# Patient Record
Sex: Female | Born: 1955 | Race: White | Hispanic: No | Marital: Married | Smoking: Former smoker
Health system: Southern US, Community
[De-identification: ages and names within clinical notes are randomized; demographics above are authoritative.]

## PROBLEM LIST (undated history)

## (undated) DIAGNOSIS — C801 Malignant (primary) neoplasm, unspecified: Secondary | ICD-10-CM

## (undated) DIAGNOSIS — C7951 Secondary malignant neoplasm of bone: Secondary | ICD-10-CM

## (undated) DIAGNOSIS — E785 Hyperlipidemia, unspecified: Secondary | ICD-10-CM

## (undated) DIAGNOSIS — C220 Liver cell carcinoma: Secondary | ICD-10-CM

## (undated) DIAGNOSIS — F419 Anxiety disorder, unspecified: Secondary | ICD-10-CM

## (undated) DIAGNOSIS — Z8669 Personal history of other diseases of the nervous system and sense organs: Secondary | ICD-10-CM

## (undated) DIAGNOSIS — K7460 Unspecified cirrhosis of liver: Secondary | ICD-10-CM

---

## 1999-01-02 ENCOUNTER — Inpatient Hospital Stay (HOSPITAL_COMMUNITY): Admission: RE | Admit: 1999-01-02 | Discharge: 1999-01-04 | Payer: Self-pay | Admitting: Obstetrics and Gynecology

## 2008-09-11 ENCOUNTER — Other Ambulatory Visit: Admission: RE | Admit: 2008-09-11 | Discharge: 2008-09-11 | Payer: Self-pay | Admitting: Family Medicine

## 2012-07-02 ENCOUNTER — Other Ambulatory Visit: Payer: Self-pay

## 2012-07-02 ENCOUNTER — Ambulatory Visit
Admission: RE | Admit: 2012-07-02 | Discharge: 2012-07-02 | Disposition: A | Discharge status: Home / Self Care | Payer: Self-pay | Source: Ambulatory Visit | Attending: Family Medicine | Admitting: Family Medicine

## 2012-07-02 ENCOUNTER — Other Ambulatory Visit: Payer: Self-pay | Admitting: Family Medicine

## 2012-07-02 DIAGNOSIS — R61 Generalized hyperhidrosis: Secondary | ICD-10-CM

## 2012-07-02 DIAGNOSIS — R509 Fever, unspecified: Secondary | ICD-10-CM

## 2012-07-02 MED ORDER — IOHEXOL 300 MG/ML  SOLN
100.0000 mL | Freq: Once | INTRAMUSCULAR | Status: AC | PRN
  Administered 2012-07-02: 100 mL via INTRAVENOUS

## 2012-07-07 ENCOUNTER — Other Ambulatory Visit (HOSPITAL_COMMUNITY): Payer: Self-pay | Admitting: Gastroenterology

## 2012-07-07 DIAGNOSIS — K769 Liver disease, unspecified: Secondary | ICD-10-CM

## 2012-07-09 ENCOUNTER — Other Ambulatory Visit: Payer: Self-pay | Admitting: Radiology

## 2012-07-12 ENCOUNTER — Other Ambulatory Visit (HOSPITAL_COMMUNITY): Payer: Self-pay

## 2012-07-13 ENCOUNTER — Encounter (HOSPITAL_COMMUNITY): Payer: Self-pay | Admitting: Pharmacy Technician

## 2012-07-13 ENCOUNTER — Other Ambulatory Visit (HOSPITAL_COMMUNITY): Payer: Self-pay | Admitting: Gastroenterology

## 2012-07-13 ENCOUNTER — Ambulatory Visit (HOSPITAL_COMMUNITY)
Admission: RE | Admit: 2012-07-13 | Discharge: 2012-07-13 | Disposition: A | Discharge status: Home / Self Care | Payer: BC Managed Care – PPO | Source: Ambulatory Visit | Attending: Gastroenterology | Admitting: Gastroenterology

## 2012-07-13 ENCOUNTER — Encounter (HOSPITAL_COMMUNITY): Payer: Self-pay

## 2012-07-13 DIAGNOSIS — K7689 Other specified diseases of liver: Secondary | ICD-10-CM | POA: Insufficient documentation

## 2012-07-13 DIAGNOSIS — K769 Liver disease, unspecified: Secondary | ICD-10-CM

## 2012-07-13 LAB — CBC
MCH: 25.9 pg — ABNORMAL LOW (ref 26.0–34.0)
MCV: 83.5 fL (ref 78.0–100.0)
Platelets: 577 10*3/uL — ABNORMAL HIGH (ref 150–400)
RBC: 3.63 MIL/uL — ABNORMAL LOW (ref 3.87–5.11)

## 2012-07-13 MED ORDER — HYDROCODONE-ACETAMINOPHEN 5-325 MG PO TABS: 1.0000 | ORAL_TABLET | ORAL | Status: DC | PRN

## 2012-07-13 MED ORDER — MIDAZOLAM HCL 2 MG/2ML IJ SOLN
INTRAMUSCULAR | Status: AC | PRN
  Administered 2012-07-13 (×3): 0.5 mg via INTRAVENOUS
  Administered 2012-07-13: 1 mg via INTRAVENOUS

## 2012-07-13 MED ORDER — ONDANSETRON HCL 4 MG/2ML IJ SOLN
INTRAMUSCULAR | Status: AC | PRN
Start: 2012-07-13 — End: 2012-07-13
  Administered 2012-07-13: 4 mg via INTRAVENOUS

## 2012-07-13 MED ORDER — SODIUM CHLORIDE 0.9 % IV SOLN
Freq: Once | INTRAVENOUS | Status: AC
  Administered 2012-07-13: 13:00:00 via INTRAVENOUS

## 2012-07-13 MED ORDER — FENTANYL CITRATE 0.05 MG/ML IJ SOLN
INTRAMUSCULAR | Status: AC
  Filled 2012-07-13: qty 4

## 2012-07-13 MED ORDER — ONDANSETRON HCL 4 MG/2ML IJ SOLN
INTRAMUSCULAR | Status: AC
  Filled 2012-07-13: qty 2

## 2012-07-13 MED ORDER — MIDAZOLAM HCL 2 MG/2ML IJ SOLN
INTRAMUSCULAR | Status: AC
  Filled 2012-07-13: qty 4

## 2012-07-13 MED ORDER — ONDANSETRON HCL 4 MG/2ML IJ SOLN: 4.0000 mg | Freq: Once | INTRAMUSCULAR | Status: DC

## 2012-07-13 MED ORDER — FENTANYL CITRATE 0.05 MG/ML IJ SOLN
INTRAMUSCULAR | Status: AC | PRN
  Administered 2012-07-13 (×3): 25 ug via INTRAVENOUS
  Administered 2012-07-13: 50 ug via INTRAVENOUS

## 2012-07-13 NOTE — Procedures (Signed)
Liver lesion Bx No comp

## 2012-07-13 NOTE — H&P (Signed)
Chief Complaint: "I'm here for a liver biopsy" Referring Physician:Katopes HPI: Amanda Oconnell is an 56 y.o. female who is found to have a liver mass after workup of FUO.  Denies any infectious sxs such as cough, abd pain, dysuria, diarrhea. She has been referred for biopsy.  Past Medical History: No past medical history on file.  Past Surgical History: No past surgical history on file.  Family History: No family history on file.  Social History: Denies tobacco, occasional EtOH  Allergies: No Known Allergies  Medications: No reg meds  Please HPI for pertinent positives, otherwise complete 10 system ROS negative.  Physical Exam: There were no vitals taken for this visit.    General Appearance:  Alert, cooperative, no distress, appears stated age, thin  Head:  Normocephalic, without obvious abnormality, atraumatic  ENT: Unremarkable  Neck: Supple, symmetrical, trachea midline, no adenopathy, thyroid: not enlarged, symmetric, no tenderness/mass/nodules  Lungs:   Clear to auscultation bilaterally, no w/r/r, respirations unlabored without use of accessory muscles.  Heart:  Regular rate and rhythm, S1, S2 normal, no murmur, rub or gallop. Carotids 2+ without bruit.  Abdomen:   Soft, non-tender, non distended. Bowel sounds active all four quadrants,  no masses, no organomegaly.  Neurologic: Normal affect, no gross deficits.   Results for orders placed during the hospital encounter of 07/13/12 (from the past 48 hour(s))  CBC     Status: Abnormal   Collection Time   07/13/12 12:47 PM      Component Value Range Comment   WBC 13.9 (*) 4.0 - 10.5 K/uL    RBC 3.63 (*) 3.87 - 5.11 MIL/uL    Hemoglobin 9.4 (*) 12.0 - 15.0 g/dL    HCT 04.5 (*) 40.9 - 46.0 %    MCV 83.5  78.0 - 100.0 fL    MCH 25.9 (*) 26.0 - 34.0 pg    MCHC 31.0  30.0 - 36.0 g/dL    RDW 81.1  91.4 - 78.2 %    Platelets 577 (*) 150 - 400 K/uL    No results found.  Assessment/Plan Liver mass Discussed US  guided biopsy procedure, including risks and complications Labs pending Consent signed in chart Path report will need to be forwarded to Dr. Yevonne Pax at [facility].  Brayton El PA-C 07/13/2012, 1:35 PM

## 2012-07-20 ENCOUNTER — Inpatient Hospital Stay (HOSPITAL_COMMUNITY): Admission: RE | Admit: 2012-07-20 | Payer: Self-pay | Source: Ambulatory Visit

## 2012-07-23 ENCOUNTER — Encounter (HOSPITAL_COMMUNITY): Payer: Self-pay | Admitting: Pharmacy Technician

## 2012-07-23 ENCOUNTER — Other Ambulatory Visit (HOSPITAL_COMMUNITY): Payer: Self-pay | Admitting: Gastroenterology

## 2012-07-23 DIAGNOSIS — R160 Hepatomegaly, not elsewhere classified: Secondary | ICD-10-CM

## 2012-07-23 DIAGNOSIS — C220 Liver cell carcinoma: Secondary | ICD-10-CM

## 2012-07-26 ENCOUNTER — Other Ambulatory Visit: Payer: Self-pay | Admitting: Radiology

## 2012-07-27 ENCOUNTER — Ambulatory Visit (HOSPITAL_COMMUNITY)
Admission: RE | Admit: 2012-07-27 | Discharge: 2012-07-27 | Disposition: A | Discharge status: Home / Self Care | Payer: BC Managed Care – PPO | Source: Ambulatory Visit | Attending: Gastroenterology | Admitting: Gastroenterology

## 2012-07-27 ENCOUNTER — Other Ambulatory Visit: Payer: Self-pay | Admitting: Radiology

## 2012-07-27 ENCOUNTER — Encounter (HOSPITAL_COMMUNITY): Payer: Self-pay

## 2012-07-27 DIAGNOSIS — C228 Malignant neoplasm of liver, primary, unspecified as to type: Secondary | ICD-10-CM | POA: Insufficient documentation

## 2012-07-27 DIAGNOSIS — K769 Liver disease, unspecified: Secondary | ICD-10-CM | POA: Insufficient documentation

## 2012-07-27 DIAGNOSIS — C220 Liver cell carcinoma: Secondary | ICD-10-CM

## 2012-07-27 DIAGNOSIS — R160 Hepatomegaly, not elsewhere classified: Secondary | ICD-10-CM

## 2012-07-27 HISTORY — DX: Malignant (primary) neoplasm, unspecified: C80.1

## 2012-07-27 LAB — CBC
HCT: 28.9 % — ABNORMAL LOW (ref 36.0–46.0)
MCH: 25.1 pg — ABNORMAL LOW (ref 26.0–34.0)
MCV: 82.3 fL (ref 78.0–100.0)
RDW: 15.2 % (ref 11.5–15.5)
WBC: 10.3 10*3/uL (ref 4.0–10.5)

## 2012-07-27 MED ORDER — ONDANSETRON HCL 4 MG/2ML IJ SOLN
4.0000 mg | Freq: Once | INTRAMUSCULAR | Status: AC
  Administered 2012-07-27: 4 mg via INTRAVENOUS

## 2012-07-27 MED ORDER — FENTANYL CITRATE 0.05 MG/ML IJ SOLN
INTRAMUSCULAR | Status: AC | PRN
  Administered 2012-07-27: 50 ug via INTRAVENOUS
  Administered 2012-07-27: 25 ug via INTRAVENOUS
  Administered 2012-07-27: 50 ug via INTRAVENOUS

## 2012-07-27 MED ORDER — MIDAZOLAM HCL 2 MG/2ML IJ SOLN
INTRAMUSCULAR | Status: AC | PRN
  Administered 2012-07-27: 2 mg via INTRAVENOUS
  Administered 2012-07-27: 0.5 mg via INTRAVENOUS

## 2012-07-27 MED ORDER — MIDAZOLAM HCL 2 MG/2ML IJ SOLN
INTRAMUSCULAR | Status: AC
  Filled 2012-07-27: qty 4

## 2012-07-27 MED ORDER — HYDROCODONE-ACETAMINOPHEN 5-325 MG PO TABS: 1.0000 | ORAL_TABLET | ORAL | Status: DC | PRN

## 2012-07-27 MED ORDER — SODIUM CHLORIDE 0.9 % IV SOLN: Freq: Once | INTRAVENOUS | Status: DC

## 2012-07-27 MED ORDER — FENTANYL CITRATE 0.05 MG/ML IJ SOLN
INTRAMUSCULAR | Status: AC
  Filled 2012-07-27: qty 4

## 2012-07-27 MED ORDER — ONDANSETRON HCL 4 MG/2ML IJ SOLN
INTRAMUSCULAR | Status: AC
  Administered 2012-07-27: 4 mg via INTRAVENOUS
  Filled 2012-07-27: qty 2

## 2012-07-27 NOTE — Procedures (Signed)
Random liver core Bx L lobe 18 g core times 4 No comp

## 2012-07-27 NOTE — H&P (Signed)
Amanda Oconnell is an 56 y.o. female.   Chief Complaint: Dx Hepatocellular Ca 07/13/12 Scheduled now for random bx of left lobe to evaluate for cirrhosis HPI: Hepato Ca  Past Medical History  Diagnosis Date  . Cancer     hepatocellular Ca    History reviewed. No pertinent past surgical history.  History reviewed. No pertinent family history. Social History:  reports that she has quit smoking. She does not have any smokeless tobacco history on file. Her alcohol and drug histories not on file.  Allergies: No Known Allergies   (Not in a hospital admission)  Results for orders placed during the hospital encounter of 07/27/12 (from the past 48 hour(s))  APTT     Status: Abnormal   Collection Time   07/27/12  8:39 AM      Component Value Range Comment   aPTT 38 (*) 24 - 37 seconds   CBC     Status: Abnormal   Collection Time   07/27/12  8:39 AM      Component Value Range Comment   WBC 10.3  4.0 - 10.5 K/uL    RBC 3.51 (*) 3.87 - 5.11 MIL/uL    Hemoglobin 8.8 (*) 12.0 - 15.0 g/dL    HCT 04.5 (*) 40.9 - 46.0 %    MCV 82.3  78.0 - 100.0 fL    MCH 25.1 (*) 26.0 - 34.0 pg    MCHC 30.4  30.0 - 36.0 g/dL    RDW 81.1  91.4 - 78.2 %    Platelets 653 (*) 150 - 400 K/uL   PROTIME-INR     Status: Normal   Collection Time   07/27/12  8:39 AM      Component Value Range Comment   Prothrombin Time 14.8  11.6 - 15.2 seconds    INR 1.18  0.00 - 1.49    No results found.  Review of Systems  Constitutional: Negative for weight loss.  Cardiovascular: Negative for chest pain.  Gastrointestinal: Positive for abdominal pain. Negative for nausea and vomiting.    Blood pressure 125/68, pulse 84, temperature 99 F (37.2 C), temperature source Oral, resp. rate 18, height 5\' 3"  (1.6 m), weight 99 lb (44.906 kg), SpO2 100.00%. Physical Exam  Constitutional: She is oriented to person, place, and time. She appears well-developed and well-nourished.  Cardiovascular: Normal rate, regular  rhythm and normal heart sounds.   No murmur heard. Respiratory: Effort normal and breath sounds normal.  GI: Soft. Bowel sounds are normal. There is no tenderness.  Musculoskeletal: Normal range of motion.  Neurological: She is alert and oriented to person, place, and time.  Skin: Skin is warm and dry.  Psychiatric: She has a normal mood and affect. Her behavior is normal. Judgment and thought content normal.     Assessment/Plan Newly dx hepatocellular ca Now scheduled for random bx of left lobe to evaluate for cirrhosis Pt aware of procedure benefits and risks and agreeable to proceed Consent signed and in chart  Talton Delpriore A 07/27/2012, 9:27 AM

## 2012-07-30 ENCOUNTER — Telehealth (HOSPITAL_COMMUNITY): Payer: Self-pay

## 2012-08-17 HISTORY — PX: CHOLECYSTECTOMY: SHX55

## 2012-08-17 HISTORY — PX: LIVER RESECTION: SHX1977

## 2013-01-31 IMAGING — US US BIOPSY
1 series · 8 of 8 positions shown · non-contrast
Comparison: none

Clinical Data/Indication: Liver lesion

ULTRASOUND-GUIDED BIOPSY OF A CENTRAL LIVER LESION.  CORE.
Sedation: Versed 2.25 mg, Fentanyl 125 mcg.
Total Moderate Sedation Time: 18 minutes.
Additional Medications: Zofran 4 mg IV.
Procedure: The procedure, risks, benefits, and alternatives were
explained to the patient. Questions regarding the procedure were
encouraged and answered. The patient understands and consents to
the procedure.
The right upper quadrant was prepped with betadine in a sterile
fashion, and a sterile drape was applied covering the operative
field. A mask and sterile gloves were used for the procedure.
Under sonographic guidance, a 17 gauge needle was inserted into the
right lobe of the liver and directed towards the central liver
lesions.  For the 18 gauge core biopsies were obtained.  Gelfoam
slurry was injected into the tract during guide needle removal.
Final imaging was performed.
Patient tolerated the procedure well without complication.  Vital
sign monitoring by nursing staff during the procedure will continue
as patient is in the special procedures unit for post procedure
observation.

[Series 1: us biopsy · 0.32mm/px · 8 of 8 slices shown]
[im 1/8]
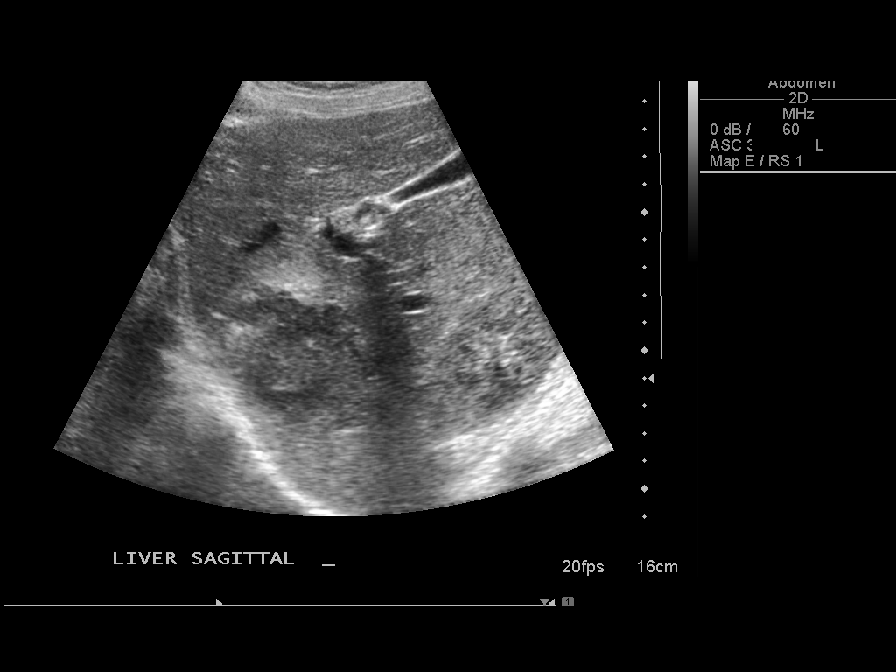
[im 2/8]
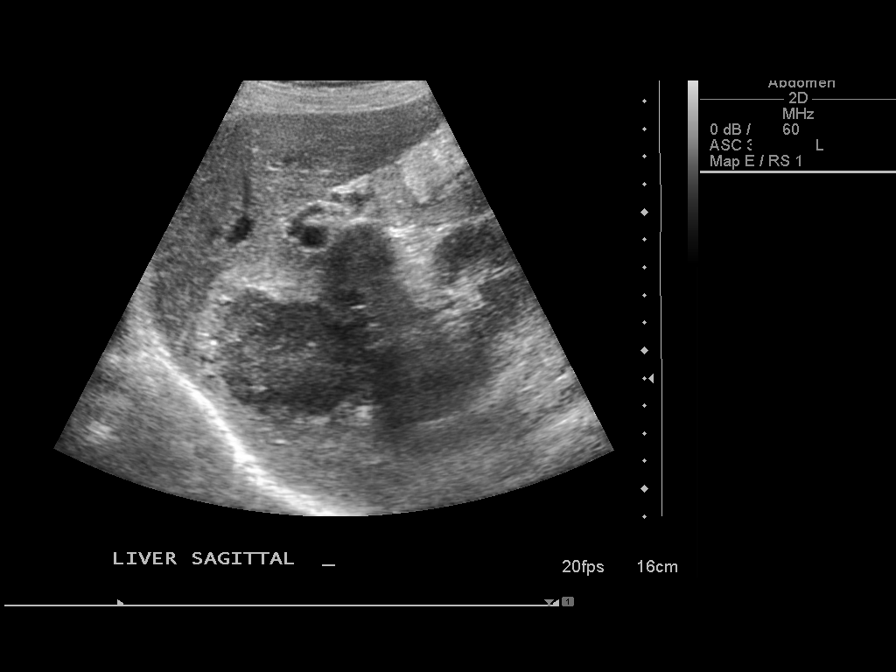
[im 3/8]
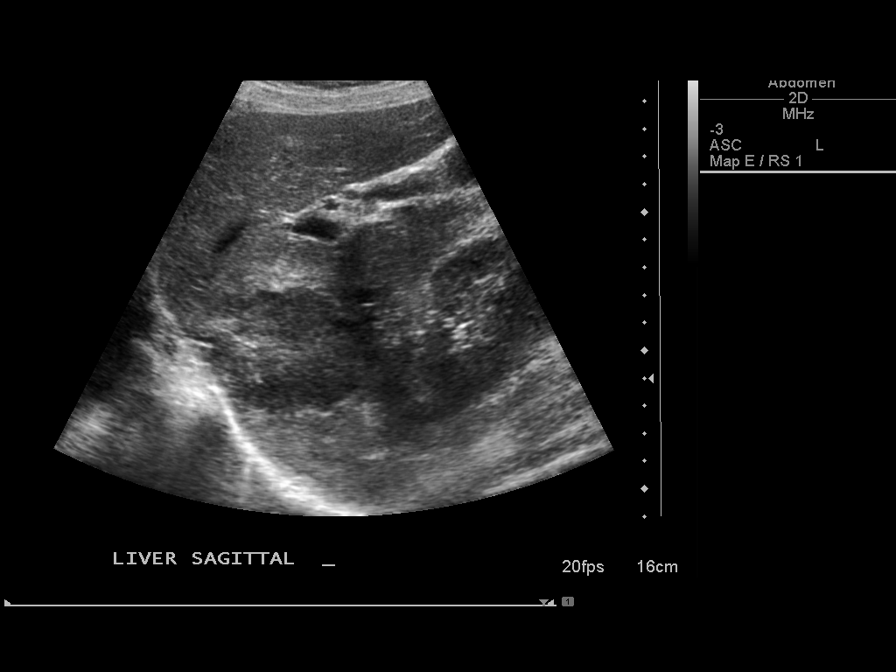
[im 4/8]
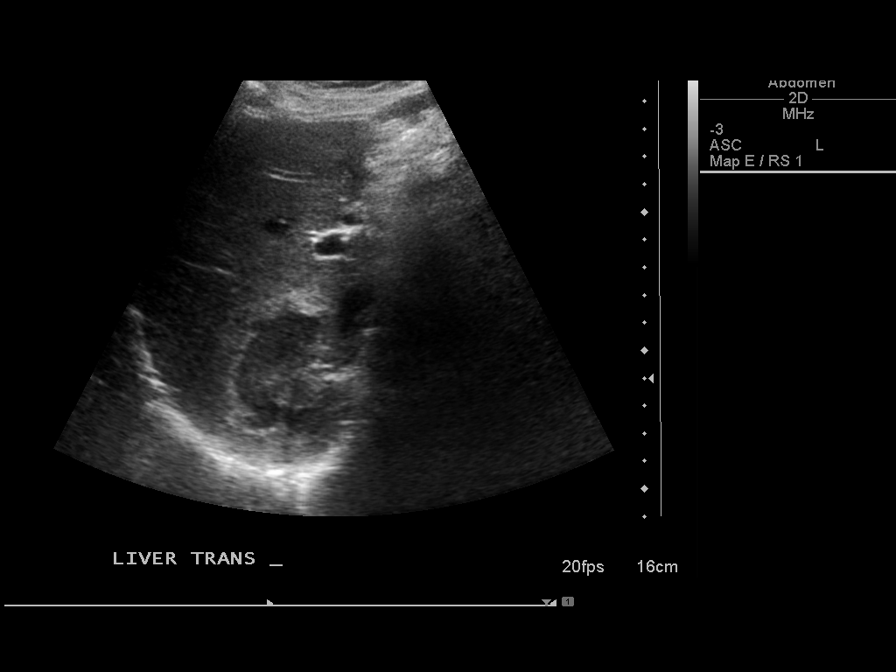
[im 5/8]
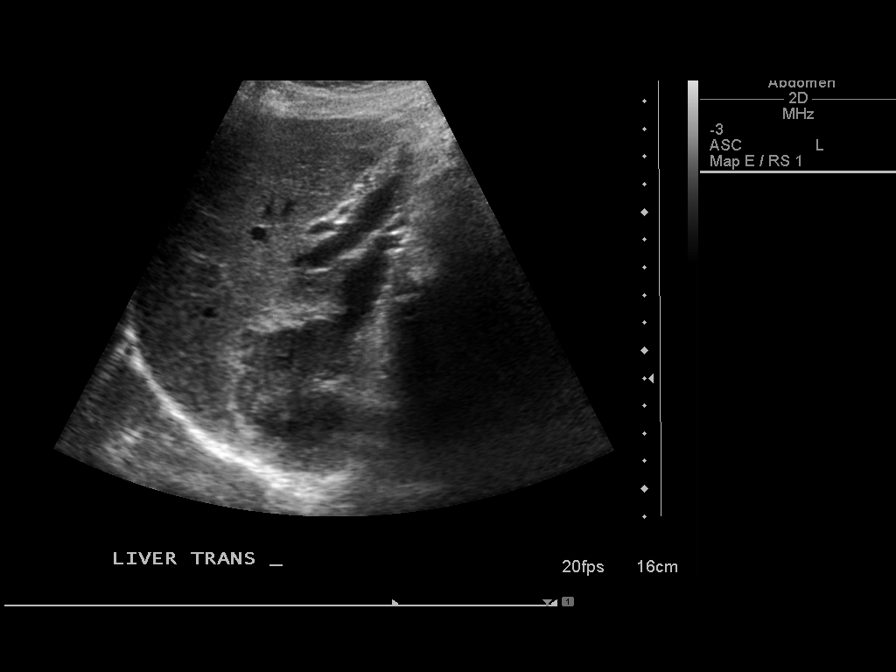
[im 6/8]
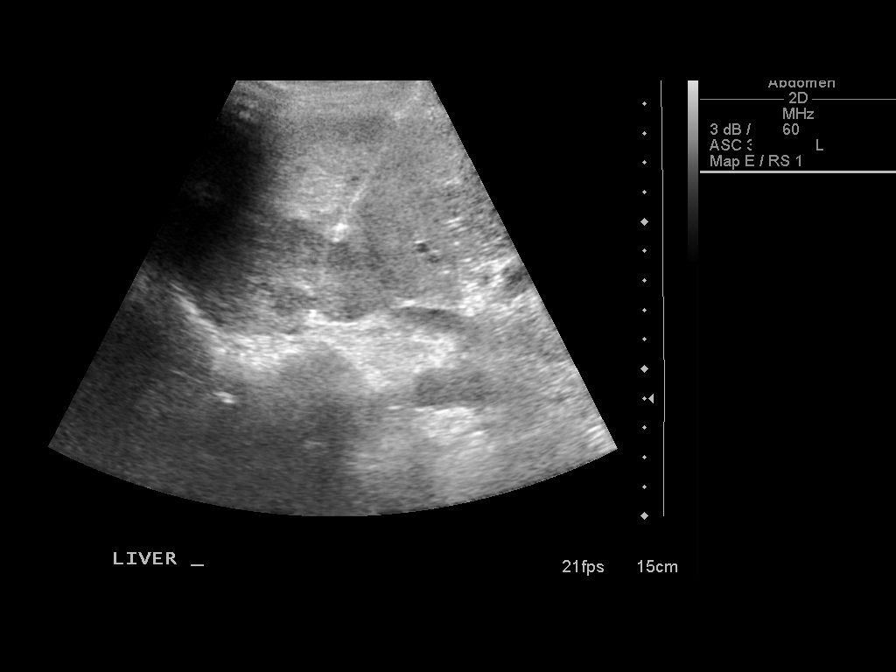
[im 7/8]
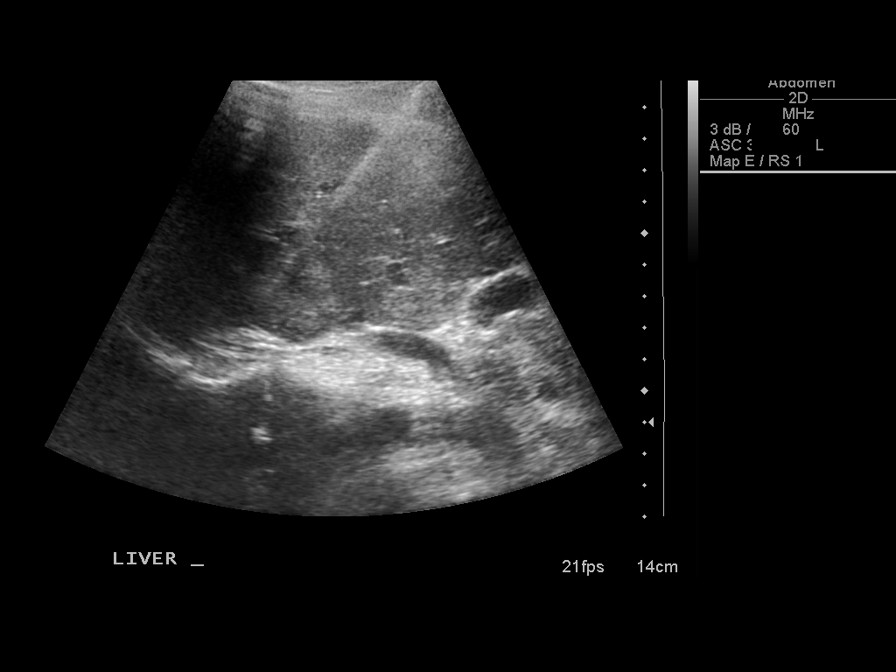
[im 8/8]
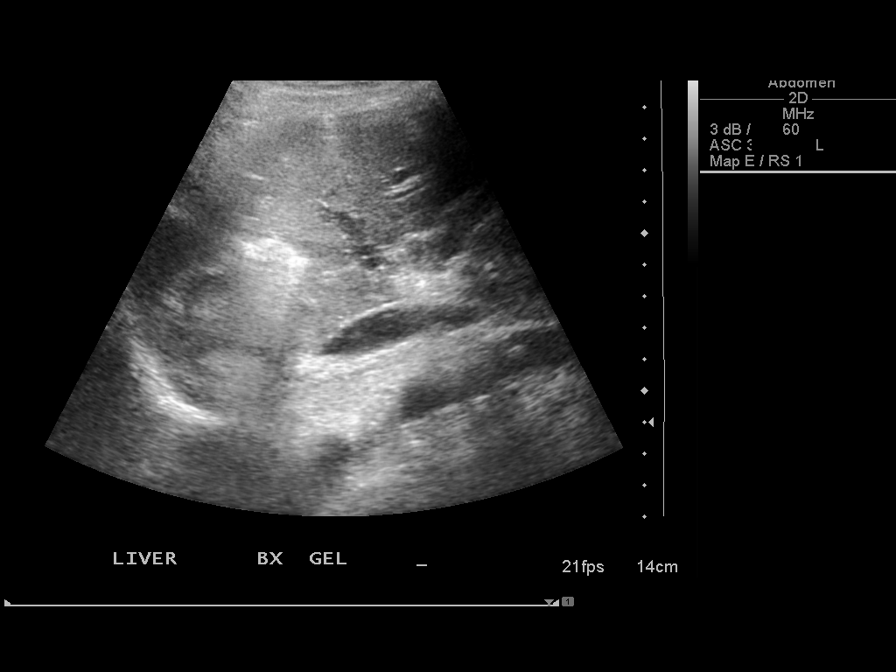

[8 of 8 positions shown; findings below may reference images not displayed]

FINDINGS: The images document guide needle placement within the
central liver lesion. Post biopsy images demonstrate no hemorrhage.
IMPRESSION: Successful ultrasound-guided core biopsy of a central liver lesion.

## 2013-02-22 ENCOUNTER — Encounter: Payer: Self-pay | Admitting: Dietician

## 2013-02-22 ENCOUNTER — Encounter: Payer: BC Managed Care – PPO | Attending: Family Medicine | Admitting: Dietician

## 2013-02-22 VITALS — Ht 63.0 in | Wt 90.6 lb

## 2013-02-22 DIAGNOSIS — Z713 Dietary counseling and surveillance: Secondary | ICD-10-CM | POA: Insufficient documentation

## 2013-02-22 DIAGNOSIS — R634 Abnormal weight loss: Secondary | ICD-10-CM | POA: Insufficient documentation

## 2013-02-22 NOTE — Progress Notes (Signed)
Medical Nutrition Therapy:  Appt start time: 0900 end time:  1000.  Assessment:  Primary concerns today: unintentional wt loss.   Pt states normal wt of 110-115 lbs. Lowest wt of 85 lbs post surgery. Now 90.6 lbs.   MEDICATIONS: see list.   DIETARY INTAKE:  Usual eating pattern includes 3 meals and 2 snacks per day.  Everyday foods include coke, sweet tea.  Avoided foods include none noted.    24-hr recall:  B ( AM): cheerios (whole milk) with hot tea (sweet n low)  Snk ( AM): none L ( PM): sandwich (ham or roast beef, sometimes with cheese, mayo, white bread), chips (small bag), coke Snk ( PM): candy bar (king size) or peanuts, sometimes nothing  D ( PM): meat (usually beef), salad, potato, or veg (broccoli, green beans), coke or sweet tea (sugar) Snk ( PM): popcorn, sometimes ice cream, coke Beverages: hot tea, coke, sweet tea  Usual physical activity: PT 3 days per week, nothing else since surgery  Progress Towards Goal(s):  In progress.   Nutritional Diagnosis:  [place]-3.2 Unintentional weight loss As related to liver surgery, decreased appetitie.  As evidenced by pt reports of difficulty taking in large portions at any meal, recent wt loss of 25-30 lbs.    Intervention:  Nutrition counseling provided for High Protein, High Calorie Nutrition Therapy. Pt also requested information for general guidelines to improve health and reduce cancer risk once appropriate weight reestablished. RD provided basic outline on incorporating high protein and high fiber foods at each meal, and focusing on plant or fish-based fat sources (for after weight regain), while limiting animal based fats and simple sugars.  Handouts given during visit include:  High Protein, High Calorie Nutrition Therapy  High Fat, Sugar, Protein, Fiber foods list  Monitoring/Evaluation:  Dietary intake, exercise, appetite, and body weight in 2 month(s).

## 2013-03-14 ENCOUNTER — Encounter: Payer: Self-pay | Admitting: Family Medicine

## 2013-03-21 DIAGNOSIS — C801 Malignant (primary) neoplasm, unspecified: Secondary | ICD-10-CM | POA: Insufficient documentation

## 2013-03-22 ENCOUNTER — Encounter: Payer: Self-pay | Admitting: Radiation Oncology

## 2013-03-22 DIAGNOSIS — C220 Liver cell carcinoma: Secondary | ICD-10-CM | POA: Insufficient documentation

## 2013-03-22 NOTE — Progress Notes (Addendum)
Histology and Location of Primary Cancer: liver  Liver, needle/core biopsy, Central - HEPATOCELLULAR CARCINOMA. - SEE COMMENT. Microscopic Comment The carcinoma appears grade 2. By immunohistochemistry, the malignant cells are positive for cytokeratin 7 and focally positive for CEA. They are negative for TTF-1, Hepar-1, GCDFP, TTF, cytokeratin 20, estrogen receptor, CDX-2, and CD34. Although this immunohistochemistry profile is non-specific, the histologic features are consistent with hepatocellular  Sites of Visceral and Bony Metastatic Disease: L1, L 5, S1, bilateral femurs, new hepatic lesions  Location(s) of Symptomatic Metastases: lumbar spine, compression fx L1, L5  Past/Anticipated chemotherapy by medical oncology, if any: possible chemotherapy at [facility], trial E 7080, Dr Reginia Naas  Pain on a scale of 0-10 is:   Lumbar back pain since March, wears back brace except when sleeping   If Spine Met(s), symptoms, if any, include:  Bowel/Bladder retention or incontinence (please describe): none  Numbness or weakness in extremities (please describe): none  Current Decadron regimen, if applicable: none  Ambulatory status? Walker? Wheelchair?: ambulatory  SAFETY ISSUES:  Prior radiation? no  Pacemaker/ICD? no  Possible current pregnancy? no  Is the patient on methotrexate? 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 Pt c/o lower back pain today 7-8/10. She has no taken Oxycodone since last night, states it makes it difficult for her to function during the day. She states at times she has no pain. She is requesting refill on Xanax, Oxycodone today. Pt states she has n/v at times, Phenergan prn w/fair to good relief.  Pt circled 10 on distress screen; left vm for SW. Her concerns checked on the screen are n/v, loss of appetite. Spoke w/pt who states "I am not suicidal. I am not suicidal."  Informed the pt about [facility]  support team and what they can offer her, and she verbalized understanding and appreciation for the information. Pt here w/spouse and friend.

## 2013-03-23 ENCOUNTER — Ambulatory Visit
Admission: RE | Admit: 2013-03-23 | Discharge: 2013-03-23 | Disposition: A | Discharge status: Home / Self Care | Payer: BC Managed Care – PPO | Source: Ambulatory Visit | Attending: Radiation Oncology | Admitting: Radiation Oncology

## 2013-03-23 ENCOUNTER — Encounter: Payer: Self-pay | Admitting: Radiation Oncology

## 2013-03-23 VITALS — BP 128/78 | HR 102 | Temp 98.4°F | Resp 20 | Ht 63.0 in | Wt 90.1 lb

## 2013-03-23 DIAGNOSIS — C228 Malignant neoplasm of liver, primary, unspecified as to type: Secondary | ICD-10-CM | POA: Insufficient documentation

## 2013-03-23 DIAGNOSIS — C7951 Secondary malignant neoplasm of bone: Secondary | ICD-10-CM

## 2013-03-23 DIAGNOSIS — C220 Liver cell carcinoma: Secondary | ICD-10-CM

## 2013-03-23 HISTORY — DX: Anxiety disorder, unspecified: F41.9

## 2013-03-23 HISTORY — DX: Unspecified cirrhosis of liver: K74.60

## 2013-03-23 HISTORY — DX: Hyperlipidemia, unspecified: E78.5

## 2013-03-23 HISTORY — DX: Liver cell carcinoma: C22.0

## 2013-03-23 HISTORY — DX: Personal history of other diseases of the nervous system and sense organs: Z86.69

## 2013-03-23 HISTORY — DX: Secondary malignant neoplasm of bone: C79.51

## 2013-03-23 MED ORDER — ONDANSETRON HCL 8 MG PO TABS: 8.0000 mg | ORAL_TABLET | Freq: Three times a day (TID) | ORAL | Status: AC | PRN

## 2013-03-23 MED ORDER — PROMETHAZINE HCL 25 MG PO TABS: 25.0000 mg | ORAL_TABLET | Freq: Four times a day (QID) | ORAL | Status: AC | PRN

## 2013-03-23 MED ORDER — LORAZEPAM 0.5 MG PO TABS: 0.5000 mg | ORAL_TABLET | Freq: Three times a day (TID) | ORAL | Status: AC | PRN

## 2013-03-23 MED ORDER — OXYCODONE HCL 5 MG PO TABS
5.0000 mg | ORAL_TABLET | ORAL | Status: DC | PRN
Start: 2013-03-23 — End: 2013-04-05

## 2013-03-23 NOTE — Progress Notes (Signed)
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 female who is seen out of the courtesy of Dr. Jackelyn Poling for an opinion concerning radiation therapy as part of management of patient's recurrent hepatocellular carcinoma.  Late last year the patient presented with night sweats and fever. She was ultimately found to have hepatocellular carcinoma on ultrasound core biopsy of the liver in November of 2013. The patient was referred to Dr. Heidi Dach at [facility] and proceeded to undergo definitive surgery on 08/17/2012.  A 7.2 cm tumor was removed from the right lobe of the  Liver.  The patient did well until spring of this year when she began having some mild low back pain. On routine imaging of the abdominal area MRI June 25 the patient was noted to have enhancing lesions up to 2.1 cm within the first, third and fourth segment of the liver consistent with multifocal hepatocellular carcinoma. In addition there were enhancing lesions noted within the lumbar spine suspicious for osseous metastasis. In addition new mild anterior wedge compression fractures at L1 and L5 were noted. The patient proceeded to undergo MRI of the cervical thoracic and lumbar spine. Within the lumbar spine area the patient was noted to have pathologic compression fractures of L1 and L5 with approximately 70 and 50% vertebral body height loss noted respectively. In addition there was a focal area of abnormal enhancement within the left lateral aspect of the S1 vertebral body consistent with  metastatic osseous disease. In light of the patient's pain in the lower back and MRI findings she is now referred to radiation oncology for consideration for palliative treatments. After anticipated completion of radiation therapy the patient will proceed with a clinical trial at [facility] comparing sorafenib versus another multi-kinase inhibitor  (E7050).  PREVIOUS RADIATION THERAPY: No  PAST MEDICAL HISTORY:  has a past medical history of Cancer; migraines; Hyperlipidemia; Cirrhosis of liver not due to alcohol; Hepatocellular carcinoma; Anxiety; and Metastasis to bone.    PAST SURGICAL HISTORY: Past Surgical History  Procedure Laterality Date  . Liver resection Right 08/17/12    right lobe resection, gallbladder, adrenal gland   . Cholecystectomy  08/17/12    w/liver resection    FAMILY HISTORY: family history includes Diabetes in her father; Hypertension in her father and mother; and Stroke in her father and mother.  SOCIAL HISTORY:  reports that she has quit smoking. She does not have any smokeless tobacco history on file.  ALLERGIES: Augmentin  MEDICATIONS:  Current Outpatient Prescriptions  Medication Sig Dispense Refill  . ALPRAZolam (XANAX) 0.5 MG tablet Take 0.5 mg by mouth at bedtime as needed for sleep.      . cyclobenzaprine (FLEXERIL) 5 MG tablet Take 5 mg by mouth 3 (three) times daily as needed for muscle spasms.      . mirtazapine (REMERON) 15 MG tablet Take 15 mg by mouth at bedtime.      Marland Kitchen omeprazole (PRILOSEC) 20 MG capsule Take 20 mg by mouth daily.      Marland Kitchen oxyCODONE (OXY IR/ROXICODONE) 5 MG immediate release tablet Take 1 tablet (5  mg total) by mouth every 4 (four) hours as needed for pain.  60 tablet  0  . promethazine (PHENERGAN) 25 MG tablet Take 1 tablet (25 mg total) by mouth every 6 (six) hours as needed for nausea. For nausea  30 tablet  1  . LORazepam (ATIVAN) 0.5 MG tablet Place 1 tablet (0.5 mg total) under the tongue every 8 (eight) hours as needed  for anxiety (or nausea).  30 tablet  0  . ondansetron (ZOFRAN) 8 MG tablet Take 1 tablet (8 mg total) by mouth every 8 (eight) hours as needed for nausea.  20 tablet  0   No current facility-administered medications for this encounter.    REVIEW OF SYSTEMS:  A 15 point review of systems is documented in the electronic medical record. This was obtained by the nursing staff. However, I reviewed this with the patient to discuss relevant findings and make appropriate changes. Back pain as above. She denies any numbness or weakness in her lower extremities. Patient denies any bowel or bladder incontinence.  She does have a poor appetite at this time and nausea   PHYSICAL EXAM:  height is 5\' 3"  (1.6 m) and weight is 90 lb 1.6 oz (40.869 kg). Her oral temperature is 98.4 F (36.9 C). Her blood pressure is 128/78 and her pulse is 102. Her respiration is 20.   BP 128/78  Pulse 102  Temp(Src) 98.4 F (36.9 C) (Oral)  Resp 20  Ht 5\' 3"  (1.6 m)  Wt 90 lb 1.6 oz (40.869 kg)  BMI 15.96 kg/m2  General Appearance:    Alert, cooperative, no distress, appears stated age, accompanied by a good friend and husband on evaluation today,   Head:    Normocephalic, without obvious abnormality, atraumatic  Eyes:    PERRL, conjunctiva/corneas clear, EOM's intact,         Nose:   Nares normal, septum midline, mucosa normal, no drainage    or sinus tenderness  Throat:   Lips, mucosa, and tongue normal; teeth and gums normal  Neck:   Supple, symmetrical, trachea midline, no adenopathy;    thyroid:  no enlargement/tenderness/nodules; no carotid   bruit or JVD  Back:     Symmetric, no curvature, ROM normal, no CVA tenderness  Lungs:     Clear to auscultation bilaterally, respirations unlabored  Chest Wall:    No tenderness or deformity   Heart:    Regular rate and rhythm, S1 and S2 normal, no murmur, rub   or gallop     Abdomen:     Soft, non-tender, bowel sounds active all four quadrants,    no masses, no  organomegaly, scars in the right upper quadrant from her liver surgery, little adipose tissue present         Extremities:   Extremities normal, atraumatic, no cyanosis or edema  Pulses:   2+ and symmetric all extremities  Skin:   Skin color, texture, turgor normal, no rashes or lesions, significant tanning present   Lymph nodes:   Cervical, supraclavicular, and axillary nodes normal  Neurologic:    normal strength, sensation and reflexes    throughout    LABORATORY DATA:  Lab Results  Component Value Date   WBC 10.3 07/27/2012   HGB 8.8* 07/27/2012   HCT 28.9* 07/27/2012   MCV 82.3 07/27/2012   PLT 653* 07/27/2012   No results found for this basename: NA, K, CL, CO2   No results found for this basename: ALT, AST, GGT, ALKPHOS,  BILITOT     RADIOGRAPHY: Recent scans performed at [facility]    IMPRESSION: Recurrent hepatocellular carcinoma. Patient is quite symptomatic with her osseous metastasis in lumbar spine area. Patient would be a good candidate for a short course of palliative radiation therapy directed at the lumbar spine area. The patient may also benefit from  vertebroplasty and I will consult with interventional radiology concerning this issue.  PLAN: Simulation and planning tomorrow with treatments to begin early next week. Anticipate between 2 and 3 weeks of radiation therapy as part of her overall management.  I spent 60 minutes minutes face to face with the patient and more than 50% of that time was spent in counseling and/or coordination of care.   ------------------------------------------------  -----------------------------------  Billie Lade, PhD, MD

## 2013-03-23 NOTE — Progress Notes (Signed)
Please see the Nurse Progress Note in the MD Initial Consult Encounter for this patient. 

## 2013-03-24 ENCOUNTER — Ambulatory Visit
Admission: RE | Admit: 2013-03-24 | Discharge: 2013-03-24 | Disposition: A | Discharge status: Home / Self Care | Payer: BC Managed Care – PPO | Source: Ambulatory Visit | Attending: Radiation Oncology | Admitting: Radiation Oncology

## 2013-03-24 ENCOUNTER — Other Ambulatory Visit: Payer: Self-pay | Admitting: Radiation Oncology

## 2013-03-24 ENCOUNTER — Encounter: Payer: Self-pay | Admitting: *Deleted

## 2013-03-24 DIAGNOSIS — Z510 Encounter for antineoplastic radiation therapy: Secondary | ICD-10-CM | POA: Insufficient documentation

## 2013-03-24 DIAGNOSIS — R110 Nausea: Secondary | ICD-10-CM | POA: Insufficient documentation

## 2013-03-24 DIAGNOSIS — IMO0002 Reserved for concepts with insufficient information to code with codable children: Secondary | ICD-10-CM

## 2013-03-24 DIAGNOSIS — C7951 Secondary malignant neoplasm of bone: Secondary | ICD-10-CM

## 2013-03-24 DIAGNOSIS — C228 Malignant neoplasm of liver, primary, unspecified as to type: Secondary | ICD-10-CM | POA: Insufficient documentation

## 2013-03-24 DIAGNOSIS — Z79899 Other long term (current) drug therapy: Secondary | ICD-10-CM | POA: Insufficient documentation

## 2013-03-24 NOTE — Addendum Note (Signed)
Encounter addended by: Glennie Hawk, RN on: 03/24/2013  8:50 AM<BR>     Documentation filed: Charges VN

## 2013-03-24 NOTE — Progress Notes (Signed)
  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  Identity was confirmed.  All relevant records and images related to the planned course of therapy were reviewed.  The patient freely provided informed written consent to proceed with treatment after reviewing the details related to the planned course of therapy. The consent form was witnessed and verified by the simulation staff.  Then, the patient was set-up in a stable reproducible  supine position for radiation therapy.  CT images were obtained.  Surface markings were placed.  The CT images were loaded into the planning software.  Then the target and avoidance structures were contoured.  Treatment planning then occurred.  The radiation prescription was entered and confirmed.  Then, I designed and supervised the construction of a total of 3 medically necessary complex treatment devices.  I have requested : Isodose Plan.  I have ordered: dose calc.  PLAN:  The patient will receive 35 Gy in 14 fractions.  ________________________________  -----------------------------------  Billie Lade, PhD, MD

## 2013-03-28 ENCOUNTER — Ambulatory Visit
Admission: RE | Admit: 2013-03-28 | Discharge: 2013-03-28 | Disposition: A | Discharge status: Home / Self Care | Payer: BC Managed Care – PPO | Source: Ambulatory Visit | Attending: Radiation Oncology | Admitting: Radiation Oncology

## 2013-03-28 DIAGNOSIS — C7951 Secondary malignant neoplasm of bone: Secondary | ICD-10-CM

## 2013-03-28 NOTE — Progress Notes (Signed)
  Radiation Oncology         [phone] ________________________________  Name: Amanda Oconnell MRN: [id number]  Date: 03/28/2013  DOB: [birth year]  Simulation Verification Note  Status: outpatient  NARRATIVE: The patient was brought to the treatment unit and placed in the planned treatment position. The clinical setup was verified. Then port films were obtained and uploaded to the radiation oncology medical record software.  The treatment beams were carefully compared against the planned radiation fields. The position location and shape of the radiation fields was reviewed. They targeted volume of tissue appears to be appropriately covered by the radiation beams. Organs at risk appear to be excluded as planned.  Based on my personal review, I approved the simulation verification. The patient's treatment will proceed as planned.  -----------------------------------  Billie Lade, PhD, MD

## 2013-03-29 ENCOUNTER — Ambulatory Visit
Admission: RE | Admit: 2013-03-29 | Discharge: 2013-03-29 | Disposition: A | Discharge status: Home / Self Care | Payer: BC Managed Care – PPO | Source: Ambulatory Visit | Attending: Radiation Oncology | Admitting: Radiation Oncology

## 2013-03-29 ENCOUNTER — Encounter: Payer: Self-pay | Admitting: Radiation Oncology

## 2013-03-29 ENCOUNTER — Other Ambulatory Visit (HOSPITAL_COMMUNITY): Payer: BC Managed Care – PPO

## 2013-03-29 VITALS — BP 107/63 | HR 101 | Temp 98.7°F | Resp 20 | Wt 91.6 lb

## 2013-03-29 DIAGNOSIS — C7951 Secondary malignant neoplasm of bone: Secondary | ICD-10-CM

## 2013-03-29 MED ORDER — NAPROXEN 500 MG PO TABS: 500.0000 mg | ORAL_TABLET | Freq: Two times a day (BID) | ORAL | Status: DC

## 2013-03-29 NOTE — Addendum Note (Signed)
Encounter addended by: Bellina Tokarczyk Mintz Rever Pichette, RN on: 03/29/2013 11:10 AM<BR>     Documentation filed: Charges VN

## 2013-03-29 NOTE — Progress Notes (Signed)
[facility]    Radiation Oncology [address] [place]     Maryln Gottron, M.D. [place], [place] [address]               Billie Lade, M.D., Ph.D. Phone: [phone]      Molli Hazard A. Kathrynn Running, M.D. Fax: [phone]      Radene Gunning, M.D., Ph.D.         Lurline Hare, M.D.         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  Current Dose: 5 Gy  Current Fraction: 2 out of 14 planed  Planned Dose: 35 Gy  Narrative: Amanda Oconnell was seen today for weekly treatment management. The chart was checked and port films  were reviewed. Her pain is much better controlled with medications as listed below her nausea also seems to be better. She enjoyed her to to the beach last weekend.  Augmentin  Current Outpatient Prescriptions  Medication Sig Dispense Refill  . ALPRAZolam (XANAX) 0.5 MG tablet Take 0.5 mg by mouth at bedtime as needed for sleep.      . cyclobenzaprine (FLEXERIL) 5 MG tablet Take 5 mg by mouth 3 (three) times daily as needed for muscle spasms.      . fentaNYL (DURAGESIC - DOSED MCG/HR) 12 MCG/HR Place 1 patch onto the skin every 3 (three) days. Apply one patch every three days      . HYDROcodone-acetaminophen (NORCO/VICODIN) 5-325 MG per tablet       . LORazepam (ATIVAN) 0.5 MG tablet Place 1 tablet (0.5 mg total) under the tongue every 8 (eight) hours as needed for anxiety (or nausea).  30 tablet  0  . mirtazapine (REMERON) 15 MG tablet Take 15 mg by mouth at bedtime.      . naproxen (NAPROSYN) 500 MG tablet Take 1 tablet (500 mg total) by mouth 2 (two) times daily with a meal.  30 tablet  0  . omeprazole (PRILOSEC) 20 MG capsule Take 20 mg by mouth daily.      . ondansetron (ZOFRAN) 8 MG tablet Take 1  tablet (8 mg total) by mouth every 8 (eight) hours as needed for nausea.  20 tablet  0  . oxyCODONE (OXY IR/ROXICODONE) 5 MG immediate release tablet Take 1 tablet (5 mg total) by mouth every 4 (four) hours as needed for pain.  60 tablet  0  . promethazine (PHENERGAN) 25 MG tablet Take 1 tablet (25 mg total) by mouth every 6 (six) hours as needed for nausea. For nausea  30 tablet  1   No current facility-administered medications for this encounter.   Labs:  Lab Results  Component Value Date   WBC 10.3 07/27/2012   HGB 8.8* 07/27/2012   HCT 28.9* 07/27/2012   MCV 82.3 07/27/2012   PLT 653* 07/27/2012   No results found for this basename: CREATININE, BUN, NA, K, CL, CO2   No results found for this basename: ALT, AST, GGT, ALK, PHOS, BILITOT    Physical Examination:  weight is 91 lb 9.6 oz (41.549 kg). Her oral temperature is 98.7 F (37.1 C). Her blood pressure is 107/63 and  her pulse is 101. Her respiration is 20.    Wt Readings from Last 3 Encounters:  03/29/13 91 lb 9.6 oz (41.549 kg)  03/23/13 90 lb 1.6 oz (40.869 kg)  02/22/13 90 lb 9.6 oz (41.096 kg)     Lungs - Normal respiratory effort, chest expands symmetrically. Lungs are clear to auscultation, no crackles or wheezes.  Heart has regular rhythm and rate  Abdomen is soft and non tender with normal bowel sounds  Assessment:  Patient tolerating treatments well  Plan: Continue treatment per original radiation prescription.  She will see interventional radiology on July 31 for evaluation.

## 2013-03-29 NOTE — Progress Notes (Addendum)
Pt states she has mild lower back pain today but has had very good pain control w/Duragesic patch, Oxy IR, Naproxen prn. Pt needs refill on Naproxen today. She denies fatigue, loss of appetite.  Post sim ed completed. Gave pt "Radiation and You" booklet w/all pertinent information marked and discussed, re: diarrhea, fatigue, skin irritation/care, n/v/ management, nutrition, pain. Pt verbalized understanding.

## 2013-03-30 ENCOUNTER — Ambulatory Visit
Admission: RE | Admit: 2013-03-30 | Discharge: 2013-03-30 | Disposition: A | Discharge status: Home / Self Care | Payer: BC Managed Care – PPO | Source: Ambulatory Visit | Attending: Radiation Oncology | Admitting: Radiation Oncology

## 2013-03-31 ENCOUNTER — Ambulatory Visit (HOSPITAL_COMMUNITY)
Admission: RE | Admit: 2013-03-31 | Discharge: 2013-03-31 | Disposition: A | Discharge status: Home / Self Care | Payer: BC Managed Care – PPO | Source: Ambulatory Visit | Attending: Radiation Oncology | Admitting: Radiation Oncology

## 2013-03-31 ENCOUNTER — Ambulatory Visit
Admission: RE | Admit: 2013-03-31 | Discharge: 2013-03-31 | Disposition: A | Discharge status: Home / Self Care | Payer: BC Managed Care – PPO | Source: Ambulatory Visit | Attending: Radiation Oncology | Admitting: Radiation Oncology

## 2013-03-31 ENCOUNTER — Other Ambulatory Visit (HOSPITAL_COMMUNITY): Payer: Self-pay | Admitting: Interventional Radiology

## 2013-03-31 DIAGNOSIS — IMO0002 Reserved for concepts with insufficient information to code with codable children: Secondary | ICD-10-CM

## 2013-04-01 ENCOUNTER — Ambulatory Visit
Admission: RE | Admit: 2013-04-01 | Discharge: 2013-04-01 | Disposition: A | Discharge status: Home / Self Care | Payer: BC Managed Care – PPO | Source: Ambulatory Visit | Attending: Radiation Oncology | Admitting: Radiation Oncology

## 2013-04-04 ENCOUNTER — Ambulatory Visit
Admission: RE | Admit: 2013-04-04 | Discharge: 2013-04-04 | Disposition: A | Discharge status: Home / Self Care | Payer: BC Managed Care – PPO | Source: Ambulatory Visit | Attending: Radiation Oncology | Admitting: Radiation Oncology

## 2013-04-05 ENCOUNTER — Ambulatory Visit
Admission: RE | Admit: 2013-04-05 | Discharge: 2013-04-05 | Disposition: A | Discharge status: Home / Self Care | Payer: BC Managed Care – PPO | Source: Ambulatory Visit | Attending: Radiation Oncology | Admitting: Radiation Oncology

## 2013-04-05 VITALS — BP 103/62 | HR 98 | Temp 97.5°F | Resp 20 | Wt 89.8 lb

## 2013-04-05 DIAGNOSIS — C7951 Secondary malignant neoplasm of bone: Secondary | ICD-10-CM

## 2013-04-05 MED ORDER — OXYCODONE HCL 5 MG PO TABS: 5.0000 mg | ORAL_TABLET | ORAL | Status: AC | PRN

## 2013-04-05 MED ORDER — FENTANYL 12 MCG/HR TD PT72: 1.0000 | MEDICATED_PATCH | TRANSDERMAL | Status: AC

## 2013-04-05 NOTE — Progress Notes (Signed)
Pt reports diarrhea yesterday and one day last week. She takes Imodium prn. She c/o mild pain in her lower back, sacral area. She wears Duragesic patch, and takes Oxy-IR in morning and nightly. She requests refill today.  Pt fatigued, has occasional nausea and loss of appetite. Discussed foods, drinks to add calories, protein to her diet. Pt has antiemetics to take prn.

## 2013-04-05 NOTE — Progress Notes (Addendum)
Weekly Management Note Current Dose: 17.5  Gy  Projected Dose: 35 Gy   Narrative:  The patient presents for routine under treatment assessment.  CBCT/MVCT images/Port film x-rays were reviewed.  The chart was checked. Doing well. Not sure duragesic is helping but current pain regimen is controlling her pain. Requests refills. Kyphoplasty next Friday. Diminished appetite.   Physical Findings: Weight: 89 lb 12.8 oz (40.733 kg). Unchanged  Impression:  The patient is tolerating radiation.  Plan:  Continue treatment as planned. Refilled oxyir and duragesic.  Discussed strategies to increase caloric intake. Will ask dietician to contact.

## 2013-04-06 ENCOUNTER — Ambulatory Visit
Admission: RE | Admit: 2013-04-06 | Discharge: 2013-04-06 | Disposition: A | Discharge status: Home / Self Care | Payer: BC Managed Care – PPO | Source: Ambulatory Visit | Attending: Radiation Oncology | Admitting: Radiation Oncology

## 2013-04-06 ENCOUNTER — Telehealth: Payer: Self-pay | Admitting: *Deleted

## 2013-04-06 NOTE — Telephone Encounter (Signed)
CALLED PATIENT TO INFORM OF NUTRITION APPT. FOR 04-12-13, LVM FOR A RETURN CALL

## 2013-04-07 ENCOUNTER — Ambulatory Visit
Admission: RE | Admit: 2013-04-07 | Discharge: 2013-04-07 | Disposition: A | Discharge status: Home / Self Care | Payer: BC Managed Care – PPO | Source: Ambulatory Visit | Attending: Radiation Oncology | Admitting: Radiation Oncology

## 2013-04-08 ENCOUNTER — Ambulatory Visit
Admission: RE | Admit: 2013-04-08 | Discharge: 2013-04-08 | Disposition: A | Discharge status: Home / Self Care | Payer: BC Managed Care – PPO | Source: Ambulatory Visit | Attending: Radiation Oncology | Admitting: Radiation Oncology

## 2013-04-08 NOTE — Addendum Note (Signed)
Encounter addended by: Bernell List on: 04/08/2013 12:35 PM<BR>     Documentation filed: Charges VN

## 2013-04-11 ENCOUNTER — Ambulatory Visit
Admission: RE | Admit: 2013-04-11 | Discharge: 2013-04-11 | Disposition: A | Discharge status: Home / Self Care | Payer: BC Managed Care – PPO | Source: Ambulatory Visit | Attending: Radiation Oncology | Admitting: Radiation Oncology

## 2013-04-11 ENCOUNTER — Other Ambulatory Visit (HOSPITAL_COMMUNITY): Payer: Self-pay | Admitting: Interventional Radiology

## 2013-04-11 DIAGNOSIS — IMO0002 Reserved for concepts with insufficient information to code with codable children: Secondary | ICD-10-CM

## 2013-04-11 DIAGNOSIS — C7951 Secondary malignant neoplasm of bone: Secondary | ICD-10-CM

## 2013-04-12 ENCOUNTER — Telehealth (HOSPITAL_COMMUNITY): Payer: Self-pay | Admitting: Interventional Radiology

## 2013-04-12 ENCOUNTER — Telehealth: Payer: Self-pay | Admitting: *Deleted

## 2013-04-12 ENCOUNTER — Encounter: Payer: Self-pay | Admitting: Radiation Oncology

## 2013-04-12 ENCOUNTER — Ambulatory Visit
Admission: RE | Admit: 2013-04-12 | Discharge: 2013-04-12 | Disposition: A | Discharge status: Home / Self Care | Payer: BC Managed Care – PPO | Source: Ambulatory Visit | Attending: Radiation Oncology | Admitting: Radiation Oncology

## 2013-04-12 ENCOUNTER — Ambulatory Visit: Payer: BC Managed Care – PPO | Admitting: Nutrition

## 2013-04-12 VITALS — BP 113/71 | HR 104 | Temp 97.9°F | Resp 20 | Wt 89.8 lb

## 2013-04-12 DIAGNOSIS — C220 Liver cell carcinoma: Secondary | ICD-10-CM

## 2013-04-12 MED ORDER — PROCHLORPERAZINE MALEATE 10 MG PO TABS: 10.0000 mg | ORAL_TABLET | Freq: Four times a day (QID) | ORAL | Status: AC | PRN

## 2013-04-12 NOTE — Progress Notes (Signed)
[facility]    Radiation Oncology [address] [place]     Maryln Gottron, M.D. [place], [place] [address]               Billie Lade, M.D., Ph.D. Phone: [phone]      Molli Hazard A. Kathrynn Running, M.D. Fax: [phone]      Radene Gunning, M.D., Ph.D.         Lurline Hare, M.D.         Grayland Jack, M.D Weekly Treatment Management Note  Name: Amanda Oconnell     MRN: [id number]        CSN: [id number] Date: 04/12/2013      DOB: 04-Jul-1956  CC: Cala Bradford, MD         White    Status: Outpatient  Diagnosis: The encounter diagnosis was Hepatocellular carcinoma.  Current Dose: 30 Gy  Current Fraction: 12  Planned Dose: 35 Gy  Narrative: Amanda Oconnell was seen today for weekly treatment management. The chart was checked and port films  were reviewed. Patient is having some nausea and occasional dry heaves. Phenergan seems to help the most for her. This does make her sleepy and I have given her a prescription for Compazine to try. 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  Augmentin Current Outpatient Prescriptions  Medication Sig Dispense Refill  . ALPRAZolam (XANAX) 0.5 MG tablet Take 0.5 mg by mouth at bedtime as needed for sleep.      . cyclobenzaprine (FLEXERIL) 5 MG tablet Take 5 mg by mouth 3 (three) times daily as needed for muscle spasms.      . fentaNYL (DURAGESIC - DOSED MCG/HR) 12 MCG/HR Place 1 patch (12.5 mcg total) onto the skin every 3 (three) days. Apply one patch every three days  10 patch  0  . LORazepam (ATIVAN) 0.5 MG tablet Place 1 tablet (0.5 mg total) under the tongue every 8 (eight) hours as needed for anxiety (or nausea).  30 tablet  0  . mirtazapine (REMERON) 15 MG tablet Take 15 mg by mouth at bedtime.      . naproxen (NAPROSYN) 500 MG tablet Take 1 tablet (500 mg total) by mouth 2 (two) times daily with a meal.  30 tablet  0  . omeprazole (PRILOSEC) 20 MG capsule Take 20 mg by mouth daily.      . ondansetron (ZOFRAN) 8  MG tablet Take 1 tablet (8 mg total) by mouth every 8 (eight) hours as needed for nausea.  20 tablet  0  . oxyCODONE (OXY IR/ROXICODONE) 5 MG immediate release tablet Take 1 tablet (5 mg total) by mouth every 4 (four) hours as needed for pain.  90 tablet  0  . promethazine (PHENERGAN) 25 MG tablet Take 1 tablet (25 mg total) by mouth every 6 (six) hours as needed for nausea. For nausea  30 tablet  1  . prochlorperazine (COMPAZINE) 10 MG tablet Take 1 tablet (10 mg total) by mouth every 6 (six) hours as needed.  30 tablet  1   No current facility-administered medications for this encounter.   Labs:  Lab Results  Component Value Date   WBC 10.3 07/27/2012   HGB 8.8* 07/27/2012   HCT 28.9* 07/27/2012   MCV 82.3 07/27/2012   PLT 653* 07/27/2012   No results found for this basename: CREATININE, BUN, NA, K, CL, CO2   No results found for this basename: ALT, AST, GGT, ALK, PHOS, BILITOT    Physical  Examination:  weight is 89 lb 12.8 oz (40.733 kg). Her oral temperature is 97.9 F (36.6 C). Her blood pressure is 113/71 and her pulse is 104. Her respiration is 20.    Wt Readings from Last 3 Encounters:  04/12/13 89 lb 12.8 oz (40.733 kg)  04/05/13 89 lb 12.8 oz (40.733 kg)  03/29/13 91 lb 9.6 oz (41.549 kg)     Lungs - Normal respiratory effort, chest expands symmetrically. Lungs are clear to auscultation, no crackles or wheezes.  Heart has regular rhythm and rate  Abdomen is soft and non tender with normal bowel sounds  Assessment:  Patient tolerating treatments well except for above issues  Plan: Continue treatment per original radiation prescription

## 2013-04-12 NOTE — Telephone Encounter (Signed)
Called [facility] and left vm for scheduler, Victorino Dike re: pt's procedure w/Dr Titus Dubin. Advised pt does not have appointment time/date/information. Requested call back, left name, number.

## 2013-04-12 NOTE — Progress Notes (Signed)
Patient is a 57 year old female diagnosed with recurrent hepatocellular cancer with bone metastases.  She will be receiving radiation therapy.  She is a patient of Dr. Roselind Messier.  Past medical history includes migraines, hyperlipidemia, non-alcoholic cirrhosis, anxiety, and tobacco usage.  Medications include Xanax, Ativan, Remeron, Prilosec, Zofran, and Phenergan.  Labs were reviewed.  Height: 63 inches. Weight: 89.8 pounds on August 12. Usual body weight: 110-115 pounds.  Patient had a documented weight of 98 pounds in November 2013. BMI: 15.91. (underweight)  Patient reports she has nausea but typically only takes Phenergan for this.  She has not been taking her other antinausea medications.  She has a decreased appetite.  Patient tolerates milk.  She continues to consume approximately 3 meals daily however, in small amounts.  She snacks inconsistently.  Nutrition diagnosis: Unintentional weight loss related to diagnosis of recurrent hepatocellular cancer as evidenced by 22% weight loss from usual body weight.  Patient meets criteria for severe malnutrition in the context of chronic illness secondary to severe depletion of both fat and muscle stores on physical exam.  Intervention: Patient was educated to take nausea medications as prescribed by physician.  I explained that she may need more than just Phenergan to control her nausea.  She has prescriptions for both lorazepam and Zofran as well as Phenergan. Patient was encouraged to discuss nausea medication administration with nursing/physician.  Patient was educated to consume higher calorie, higher protein foods in small amounts throughout the day.  She was educated to begin [employer] essentials made with fortified milk 3 times a day between meals.  I provided her recipes to follow.  I've educated her on specific ways to add calories to foods consumed.  Fact sheets were provided on high-calorie, high-protein foods, and poor appetite.   Questions were answered and teach back method used.  Monitoring, evaluation, goals: Patient will tolerate increased oral intake to include tolerance of Carn[employer] breakfast essentials 3 times a day for weight maintenance or weight gain.  Next visit: Patient will contact me with questions or concerns.

## 2013-04-12 NOTE — Telephone Encounter (Signed)
Called pt left VM for her to call to schedule her tumor ablation procedure with Dr. Buel Ream

## 2013-04-12 NOTE — Progress Notes (Addendum)
Pt reports mild lower back pain today. She is wearing a Duragesic patch 12.5 mcg, takes Oxy-IR approximately twice daily for pain control. She states she does get 100% relief at times. Pt reports "dry heaves, no vomiting". She takes Phenergan but has not taken regularly. She states Zofran, Ativan not helpful. Advised she take Phenergan regularly to control nausea. Pt has nutrition appt today. Pt takes Imodium every morning to control diarrhea. Pt fatigued, loss of appetite due to nausea.  Pt states she "is supposed to have procedure Friday w/Dr Titus Dubin" but has no details. Unable to find appointment in Epic for pt. Pt will discuss w/Dr Roselind Messier today. Notified B Neff pt is in [employer].

## 2013-04-13 ENCOUNTER — Other Ambulatory Visit: Payer: Self-pay | Admitting: Radiology

## 2013-04-13 ENCOUNTER — Ambulatory Visit
Admission: RE | Admit: 2013-04-13 | Discharge: 2013-04-13 | Disposition: A | Discharge status: Home / Self Care | Payer: BC Managed Care – PPO | Source: Ambulatory Visit | Attending: Radiation Oncology | Admitting: Radiation Oncology

## 2013-04-14 ENCOUNTER — Ambulatory Visit
Admission: RE | Admit: 2013-04-14 | Discharge: 2013-04-14 | Disposition: A | Discharge status: Home / Self Care | Payer: BC Managed Care – PPO | Source: Ambulatory Visit | Attending: Radiation Oncology | Admitting: Radiation Oncology

## 2013-04-14 ENCOUNTER — Encounter (HOSPITAL_COMMUNITY): Payer: Self-pay

## 2013-04-14 ENCOUNTER — Other Ambulatory Visit (HOSPITAL_COMMUNITY): Payer: Self-pay | Admitting: Interventional Radiology

## 2013-04-14 ENCOUNTER — Ambulatory Visit (HOSPITAL_COMMUNITY)
Admission: RE | Admit: 2013-04-14 | Discharge: 2013-04-14 | Disposition: A | Discharge status: Home / Self Care | Payer: BC Managed Care – PPO | Source: Ambulatory Visit | Attending: Interventional Radiology | Admitting: Interventional Radiology

## 2013-04-14 DIAGNOSIS — Z880 Allergy status to penicillin: Secondary | ICD-10-CM | POA: Insufficient documentation

## 2013-04-14 DIAGNOSIS — Z87891 Personal history of nicotine dependence: Secondary | ICD-10-CM | POA: Insufficient documentation

## 2013-04-14 DIAGNOSIS — F411 Generalized anxiety disorder: Secondary | ICD-10-CM | POA: Insufficient documentation

## 2013-04-14 DIAGNOSIS — G43909 Migraine, unspecified, not intractable, without status migrainosus: Secondary | ICD-10-CM | POA: Insufficient documentation

## 2013-04-14 DIAGNOSIS — K7460 Unspecified cirrhosis of liver: Secondary | ICD-10-CM | POA: Insufficient documentation

## 2013-04-14 DIAGNOSIS — E785 Hyperlipidemia, unspecified: Secondary | ICD-10-CM | POA: Insufficient documentation

## 2013-04-14 DIAGNOSIS — Z9089 Acquired absence of other organs: Secondary | ICD-10-CM | POA: Insufficient documentation

## 2013-04-14 DIAGNOSIS — IMO0002 Reserved for concepts with insufficient information to code with codable children: Secondary | ICD-10-CM

## 2013-04-14 DIAGNOSIS — C7951 Secondary malignant neoplasm of bone: Secondary | ICD-10-CM

## 2013-04-14 DIAGNOSIS — M8448XA Pathological fracture, other site, initial encounter for fracture: Secondary | ICD-10-CM | POA: Insufficient documentation

## 2013-04-14 DIAGNOSIS — C228 Malignant neoplasm of liver, primary, unspecified as to type: Secondary | ICD-10-CM | POA: Insufficient documentation

## 2013-04-14 LAB — BASIC METABOLIC PANEL
Calcium: 8.3 mg/dL — ABNORMAL LOW (ref 8.4–10.5)
Creatinine, Ser: 0.59 mg/dL (ref 0.50–1.10)
GFR calc Af Amer: >90 mL/min (ref >90)
GFR calc non Af Amer: >90 mL/min (ref >90)

## 2013-04-14 LAB — CBC WITH DIFFERENTIAL/PLATELET
Basophils Absolute: 0 10*3/uL (ref 0.0–0.1)
Lymphocytes Relative: 3 % — ABNORMAL LOW (ref 12–46)
Neutro Abs: 8.2 10*3/uL — ABNORMAL HIGH (ref 1.7–7.7)
Platelets: 503 10*3/uL — ABNORMAL HIGH (ref 150–400)
RDW: 17.8 % — ABNORMAL HIGH (ref 11.5–15.5)
WBC: 10.3 10*3/uL (ref 4.0–10.5)

## 2013-04-14 LAB — PROTIME-INR: Prothrombin Time: 15.9 seconds — ABNORMAL HIGH (ref 11.6–15.2)

## 2013-04-14 LAB — APTT: aPTT: 39 seconds — ABNORMAL HIGH (ref 24–37)

## 2013-04-14 MED ORDER — FENTANYL CITRATE 0.05 MG/ML IJ SOLN
INTRAMUSCULAR | Status: AC
  Filled 2013-04-14: qty 6

## 2013-04-14 MED ORDER — HYDROMORPHONE HCL PF 1 MG/ML IJ SOLN
INTRAMUSCULAR | Status: AC
  Filled 2013-04-14: qty 3

## 2013-04-14 MED ORDER — SODIUM CHLORIDE 0.9 % IV SOLN: INTRAVENOUS | Status: DC

## 2013-04-14 MED ORDER — TOBRAMYCIN SULFATE 1.2 G IJ SOLR
INTRAMUSCULAR | Status: AC
  Filled 2013-04-14: qty 1.2

## 2013-04-14 MED ORDER — SODIUM CHLORIDE 0.9 % IV SOLN: Freq: Once | INTRAVENOUS | Status: DC

## 2013-04-14 MED ORDER — HYDROMORPHONE HCL PF 1 MG/ML IJ SOLN
INTRAMUSCULAR | Status: AC | PRN
  Administered 2013-04-14 (×5): 1 mg

## 2013-04-14 MED ORDER — FENTANYL CITRATE 0.05 MG/ML IJ SOLN
INTRAMUSCULAR | Status: AC | PRN
  Administered 2013-04-14: 12.5 ug via INTRAVENOUS
  Administered 2013-04-14: 50 ug via INTRAVENOUS
  Administered 2013-04-14: 25 ug via INTRAVENOUS
  Administered 2013-04-14: 12.5 ug via INTRAVENOUS
  Administered 2013-04-14: 25 ug via INTRAVENOUS

## 2013-04-14 MED ORDER — MEPERIDINE HCL 50 MG/ML IJ SOLN
INTRAMUSCULAR | Status: AC
  Filled 2013-04-14: qty 3

## 2013-04-14 MED ORDER — VANCOMYCIN HCL IN DEXTROSE 1-5 GM/200ML-% IV SOLN
1000.0000 mg | Freq: Once | INTRAVENOUS | Status: AC
  Administered 2013-04-14: 1000 mg via INTRAVENOUS
  Filled 2013-04-14: qty 200

## 2013-04-14 MED ORDER — MIDAZOLAM HCL 2 MG/2ML IJ SOLN
INTRAMUSCULAR | Status: AC
  Filled 2013-04-14: qty 6

## 2013-04-14 MED ORDER — MIDAZOLAM HCL 2 MG/2ML IJ SOLN
INTRAMUSCULAR | Status: AC | PRN
  Administered 2013-04-14: 0.5 mg via INTRAVENOUS
  Administered 2013-04-14: 1 mg via INTRAVENOUS
  Administered 2013-04-14: 2 mg via INTRAVENOUS
  Administered 2013-04-14: 1 mg via INTRAVENOUS
  Administered 2013-04-14: 0.5 mg via INTRAVENOUS

## 2013-04-14 NOTE — Procedures (Signed)
S/P L1 and L5 RF ablation with  Vertebral l body augmentation

## 2013-04-14 NOTE — ED Notes (Signed)
Patient denies pain and is resting comfortably.  

## 2013-04-14 NOTE — H&P (Signed)
Amanda Oconnell is an 57 y.o. female.   Chief Complaint: pt with hx hepatocellular cancer Rt lobe liver removal 2013 CT 01/2013 reveals new liver lesions; new spinal lesions Radiation treatments underway Back pain x months; wt loss; N/V Lesions: Lumbar 1; Lumbar 5; Sacral 1 lesions Scheduled for tumor ablation and vertebroplasty/kyphoplasty HPI: HepatocellularCa; HLD; migraines; bone mets  Past Medical History  Diagnosis Date  . Cancer     hepatocellular Ca  . Hx of migraines   . Hyperlipidemia   . Cirrhosis of liver not due to alcohol   . Hepatocellular carcinoma   . Anxiety   . Metastasis to bone     Past Surgical History  Procedure Laterality Date  . Liver resection Right 08/17/12    right lobe resection, gallbladder, adrenal gland   . Cholecystectomy  08/17/12    w/liver resection    Family History  Problem Relation Age of Onset  . Hypertension Mother   . Stroke Mother   . Diabetes Father   . Hypertension Father   . Stroke Father    Social History:  reports that she has quit smoking. She does not have any smokeless tobacco history on file. Her alcohol and drug histories are not on file.  Allergies:  Allergies  Allergen Reactions  . Augmentin [Amoxicillin-Pot Clavulanate]     flu     (Not in a hospital admission)  No results found for this or any previous visit (from the past 48 hour(s)). No results found.  Review of Systems  Constitutional: Positive for weight loss. Negative for fever.  Respiratory: Negative for shortness of breath.   Cardiovascular: Negative for chest pain.  Gastrointestinal: Positive for nausea, vomiting and abdominal pain.  Musculoskeletal: Positive for back pain.  Neurological: Positive for weakness. Negative for headaches.    Blood pressure 110/72, pulse 105, temperature 97.6 F (36.4 C), temperature source Oral, resp. rate 20, height 5\' 3"  (1.6 m), weight 89 lb (40.37 kg), SpO2 94.00%. Physical Exam  Constitutional:  She is oriented to person, place, and time.  Thin, frail  Cardiovascular: Normal rate, regular rhythm and normal heart sounds.   No murmur heard. Respiratory: Effort normal and breath sounds normal. She has no wheezes.  GI: Soft. Bowel sounds are normal. There is no tenderness.  Musculoskeletal: Normal range of motion.  Slow gait   Neurological: She is alert and oriented to person, place, and time. Coordination normal.  Skin: Skin is warm and dry.  Psychiatric: She has a normal mood and affect. Her behavior is normal. Judgment and thought content normal.     Assessment/Plan Hx Liver Ca 2013 Rt lobe surgery 2013 Recurrence of liver lesions recently with spinal mets Increasing back pain; radiation Tx now Scheduled now for L1;L5;S1 tumor ablation with VP/KP Pt aware of procedure benefits and risks and agreeable to proceed Consent signed and in chart   Baby Stairs A 04/14/2013, 9:42 AM

## 2013-04-14 NOTE — Discharge Instructions (Signed)
KYPHOPLASTY/VERTEBROPLASTY DISCHARGE INSTRUCTIONS ° °Medications: (check all that apply) ° °   Resume all home medications as before procedure. °                 °  °Continue your pain medications as prescribed as needed.  Over the next 3-5 days, decrease your pain medication as tolerated.  Over the counter medications (i.e. Tylenol, ibuprofen, and aleve) may be substituted once severe/moderate pain symptoms have subsided. ° ° Wound Care: °- Bandages may be removed the day following your procedure.  You may get your incision wet once bandages are removed.  Bandaids may be used to cover the incisions until scab formation.  Topical ointments are optional. ° °- If you develop a fever greater than 101 degrees, have increased skin redness at the incision sites or pus-like oozing from incisions occurring within 1 week of the procedure, contact radiology at [phone] or [phone]. ° °- Ice pack to back for 15-20 minutes 2-3 time per day for first 2-3 days post procedure.  The ice will expedite muscle healing and help with the pain from the incisions. ° ° Activity: °- Bedrest today with limited activity for 24 hours post procedure. ° °- No driving for 48 hours. ° °- Increase your activity as tolerated after bedrest (with assistance if necessary). ° °- Refrain from any strenuous activity or heavy lifting (greater than 10 lbs.). ° ° Follow up: °- Contact radiology at [phone] or [phone] if any questions/concerns. ° °- A physician assistant from radiology will contact you in approximately 1 week. ° °- If a biopsy was performed at the time of your procedure, your referring physician should receive the results in usually 2-3 days. ° ° ° ° ° ° ° ° °

## 2013-04-15 ENCOUNTER — Telehealth (HOSPITAL_COMMUNITY): Payer: Self-pay | Admitting: *Deleted

## 2013-04-15 NOTE — Telephone Encounter (Signed)
Post procedure follow up call.  Spoke with pt.  Says she is sore post procedure.  She is using ice and laying flat, she says this is helping some.  She will call back if she doesn't feel she is improving by Monday

## 2013-04-20 ENCOUNTER — Other Ambulatory Visit (HOSPITAL_COMMUNITY): Payer: Self-pay | Admitting: Interventional Radiology

## 2013-04-20 ENCOUNTER — Telehealth (HOSPITAL_COMMUNITY): Payer: Self-pay | Admitting: Interventional Radiology

## 2013-04-20 DIAGNOSIS — M549 Dorsalgia, unspecified: Secondary | ICD-10-CM

## 2013-04-20 DIAGNOSIS — C801 Malignant (primary) neoplasm, unspecified: Secondary | ICD-10-CM

## 2013-04-20 DIAGNOSIS — IMO0002 Reserved for concepts with insufficient information to code with codable children: Secondary | ICD-10-CM

## 2013-04-20 NOTE — Telephone Encounter (Signed)
Called pt made 2 wk f/u appt JM

## 2013-04-25 ENCOUNTER — Ambulatory Visit: Payer: BC Managed Care – PPO | Admitting: Dietician

## 2013-04-28 ENCOUNTER — Ambulatory Visit (HOSPITAL_COMMUNITY)
Admission: RE | Admit: 2013-04-28 | Discharge: 2013-04-28 | Disposition: A | Discharge status: Home / Self Care | Payer: BC Managed Care – PPO | Source: Ambulatory Visit | Attending: Interventional Radiology | Admitting: Interventional Radiology

## 2013-04-28 DIAGNOSIS — IMO0002 Reserved for concepts with insufficient information to code with codable children: Secondary | ICD-10-CM

## 2013-04-28 DIAGNOSIS — C801 Malignant (primary) neoplasm, unspecified: Secondary | ICD-10-CM

## 2013-04-28 DIAGNOSIS — M549 Dorsalgia, unspecified: Secondary | ICD-10-CM

## 2013-05-03 ENCOUNTER — Encounter: Payer: Self-pay | Admitting: Radiation Oncology

## 2013-05-03 NOTE — Progress Notes (Signed)
  Radiation Oncology         [phone]) [phone] ________________________________  Name: Amanda Oconnell MRN: [id number]  Date: 05/03/2013  DOB: 18-Jun-1956   End of Treatment Note  Diagnosis:   Recurrent hepatocellular carcinoma with painful osseous metastasis     Indication for treatment:  Lower back pain       Radiation treatment dates:   July 28 through August 14  Site/dose:   L1-S1 spine,  35 Gy in 14 fractions  Beams/energy:   AP/PA, 15 MV  photons  Narrative: The patient tolerated radiation treatment relatively well.   She did have some nausea but had this prior to starting radiation therapy. This is likely related to her liver involvement.  She did have improvement in her lower back pain.  Plan: The patient has completed radiation treatment. The patient will return to radiation oncology clinic for routine followup in one month. I advised them to call or return sooner if they have any questions or concerns related to their recovery or treatment.  -----------------------------------  Billie Lade, PhD, MD

## 2013-06-01 ENCOUNTER — Telehealth (HOSPITAL_COMMUNITY): Payer: Self-pay | Admitting: Interventional Radiology

## 2013-06-01 NOTE — Telephone Encounter (Signed)
Called pt to see if she was having any issues with her back. She states that she does not have any back pain and her back seems to be doing much better. She also states that she is still unable to eat and has lost more weight and is down to about 70 lbs. I told he patient to call me if there was anything that she needed or if I could do anything for her. She was very grateful and happy that I called to check in on her. She states she will call me back if she needs anything. JM

## 2013-07-02 DEATH — deceased

## 2013-11-02 IMAGING — XA IR RFA BONE TUMOR
1 series · 14 of 24 positions shown · non-contrast
Comparison: Recent MRI scan of the lumbosacral spine from outside
institution.

CLINICAL DATA: Severe low back pain secondary to pathologic
compression fractures at L1 and L5.

RFA BONE TUMOR ABLATION FOLLOWED BY VERTEBRAL BODY AUGMENTATION FOR
PATHOLOGIC COMPRESSION FRACTURES AT L1 AND L5

[Series 300: ir bone tumor rf ablation · 14 of 34 slices shown]
[im 1/34]
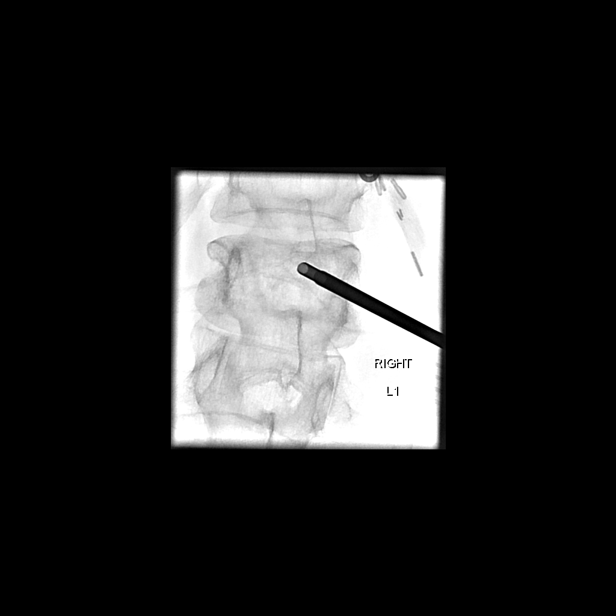
[im 3/34]
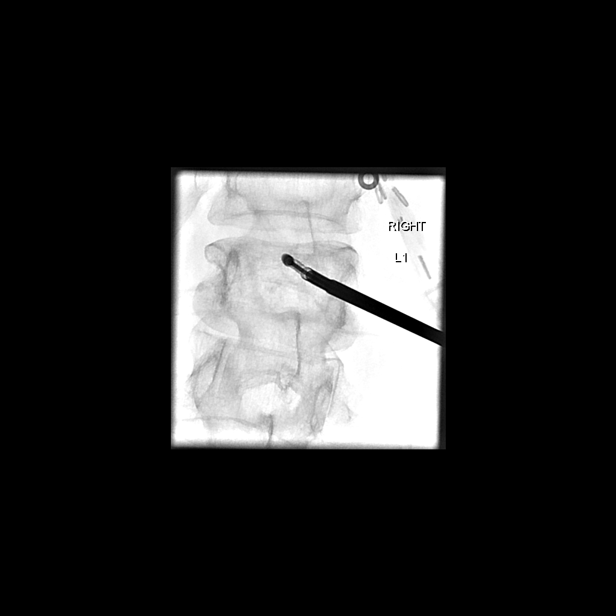
[im 6/34]
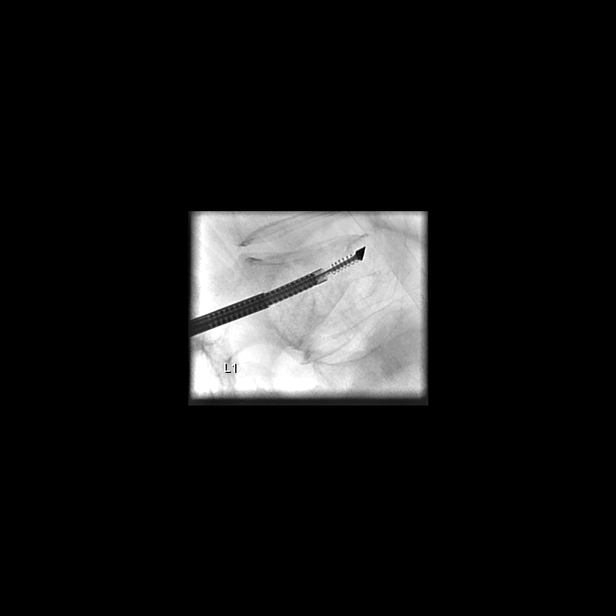
[im 9/34]
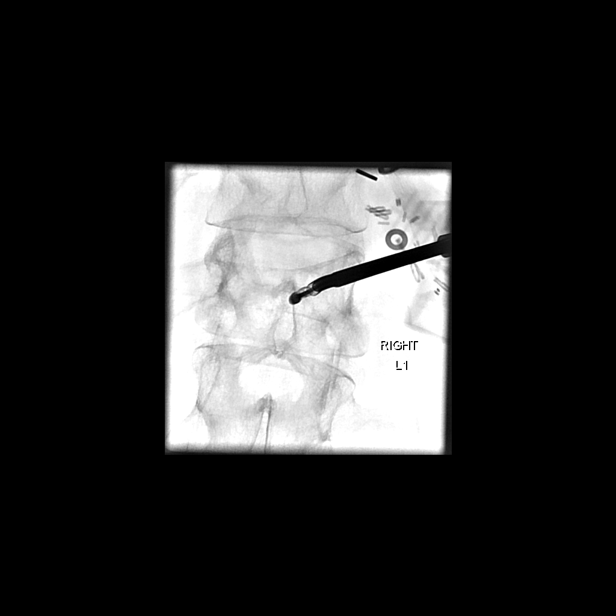
[im 11/34]
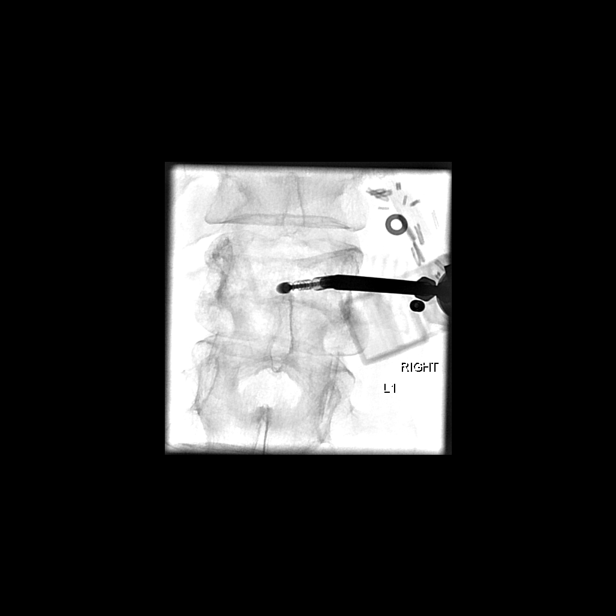
[im 13/34]
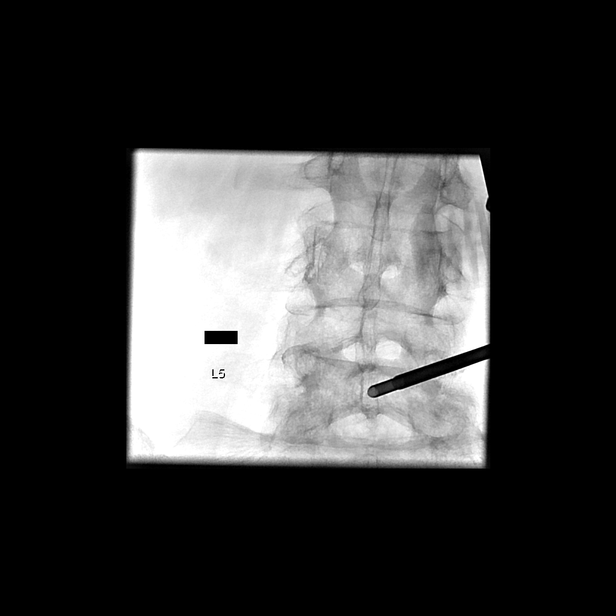
[im 16/34]
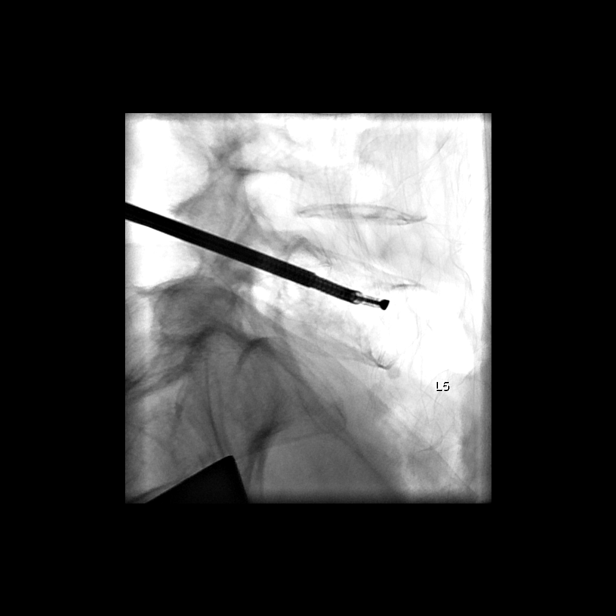
[im 18/34]
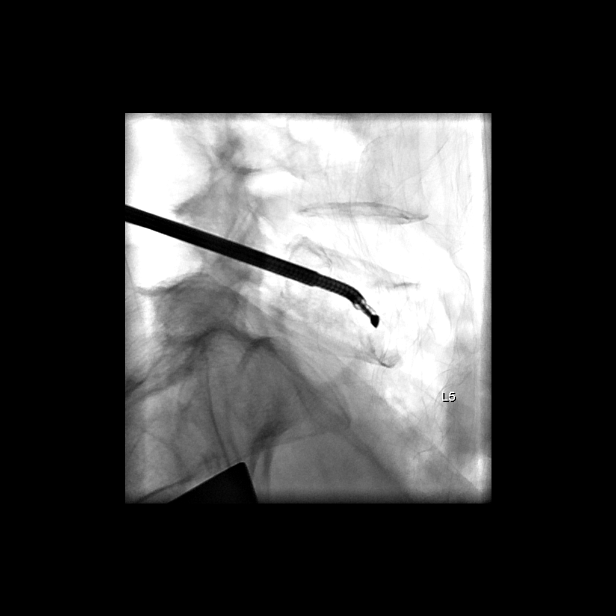
[im 21/34]
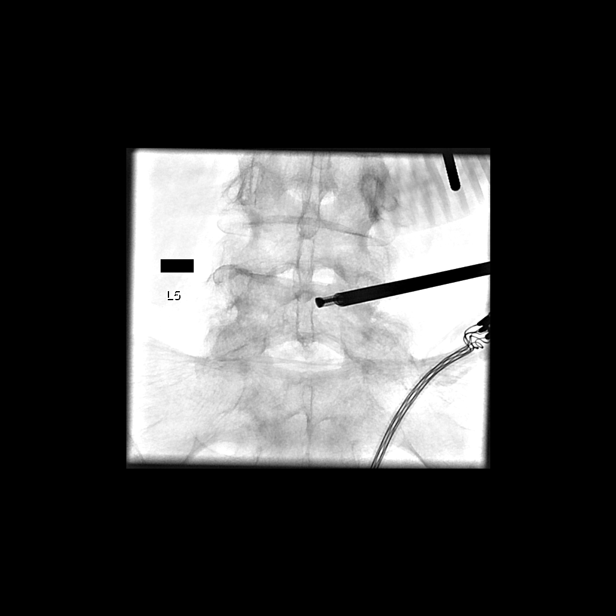
[im 23/34]
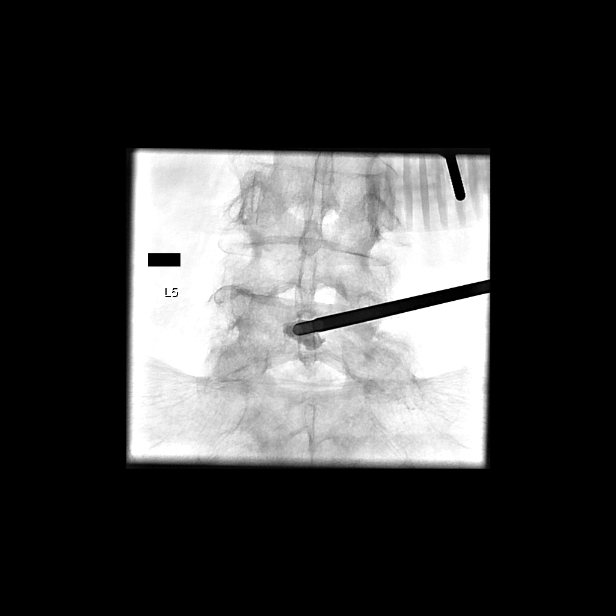
[im 26/34]
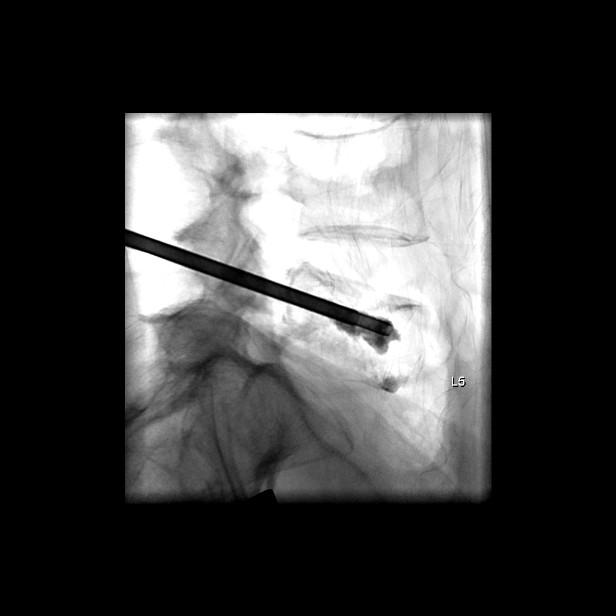
[im 28/34]
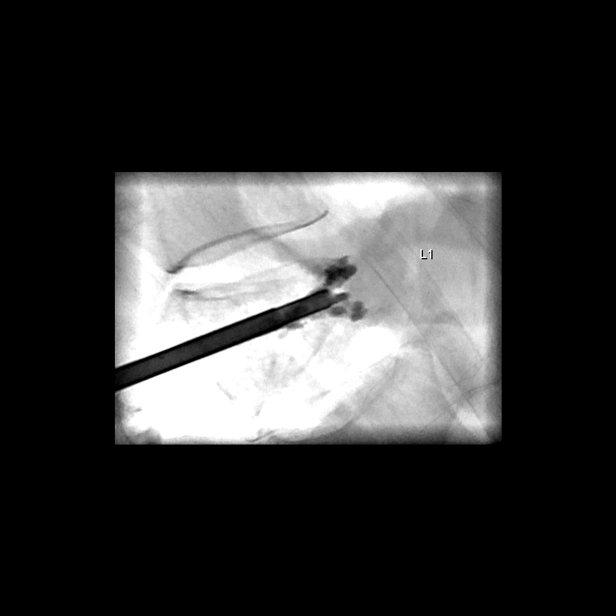
[im 31/34]
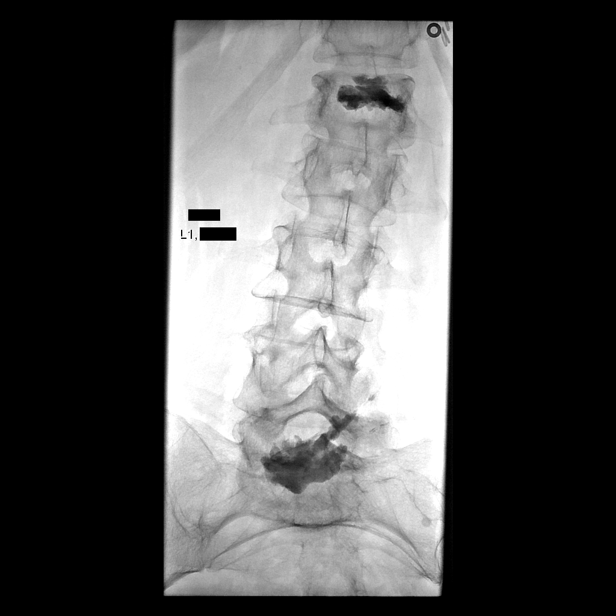
[im 34/34]
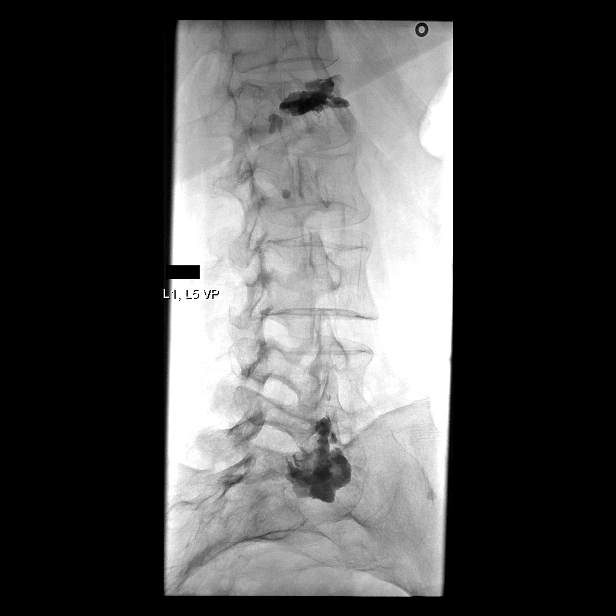

[14 of 24 positions shown; findings below may reference images not displayed]

Following a full explanation of the procedure along with the
potential associated complications, an informed witnessed consent
was obtained.

The patient was laid prone on the fluoroscopic table.

The skin overlying the lumbar region was then prepped and draped in
the usual sterile fashion.

The skin overlying the right pedicle at L1, and the right pedicle
at L5 was then infiltrated with 0.25% bupivacaine, followed by the
advancement of a 10.5-gauge DFine spine needle into the posterior
third at L1 and at L5.  DFine core biopsy devices were then
advanced through both the needles.  Core samples were then obtained
using 20 ml syringes, and sent for pathologic analysis.

Through the needles, DFine radiofrequency instrumentation was then
advanced, and manipulated in different direction at both levels to
create ablation.

At L1, ablation time of 3.38 minutes was utilized with ablation
being performed in the superior, neutral, inferior and neutral
positions, approaching distal temperatures of 50 degrees Celsius.

At L5, total ablation time of 2 minutes and 30 minutes was
undertaken with distal temperatures of 50 degrees Celsius.

The DFine devices were then removed.

The needles were then advanced to the junction of the anterior and
middle thirds at each of the levels.

At this time methylmethacrylate mixture was reconstituted with
Tobramycin in the DFine mixing system.

This was then loaded onto the DFine injector device.  The injector
device was then locked into position at the hubs of the DFine spine
needles.

Using biplane intermittent fluoroscopy, at both levels, pulsed
delivery of the methylmethacrylate mixture was then performed with
excellent filling in the AP and lateral projections.

There was no extrusion seen into adjacent disc spaces or
posteriorly into the spinal canal.  No paraspinous venous
contamination was seen at either of the levels.

The patient tolerated the procedure well.  There were no acute
complications.

The working needles were then removed.  Hemostasis was achieved at
the skin entry sites.

Medications utilized: 5 mg of Versed.  125 mcg of Fentanyl.
Dilaudid 5 mg.

IMPRESSION
1.  Status post fluoroscopic-guided needle placement for deep core
bone biopsies at L1 and L5.
2.  Status post radiofrequency tumor ablation at L1 and L5 for
pathologic compression fractures, followed by vertebral body
augmentation using the DFine augmentation system.
